# Patient Record
Sex: Male | Born: 1940 | Race: White | Hispanic: No | Smoking: Former smoker
Health system: Southern US, Community
[De-identification: ages and names within clinical notes are randomized; demographics above are authoritative.]

## PROBLEM LIST (undated history)

## (undated) DIAGNOSIS — J8410 Pulmonary fibrosis, unspecified: Secondary | ICD-10-CM

## (undated) DIAGNOSIS — C61 Malignant neoplasm of prostate: Secondary | ICD-10-CM

## (undated) DIAGNOSIS — J439 Emphysema, unspecified: Secondary | ICD-10-CM

## (undated) DIAGNOSIS — K219 Gastro-esophageal reflux disease without esophagitis: Secondary | ICD-10-CM

## (undated) DIAGNOSIS — C3490 Malignant neoplasm of unspecified part of unspecified bronchus or lung: Secondary | ICD-10-CM

## (undated) HISTORY — PX: HERNIA REPAIR: SHX51

---

## 2013-07-05 DIAGNOSIS — C61 Malignant neoplasm of prostate: Secondary | ICD-10-CM

## 2013-07-05 HISTORY — DX: Malignant neoplasm of prostate: C61

## 2013-07-05 HISTORY — PX: OTHER SURGICAL HISTORY: SHX169

## 2014-07-29 DIAGNOSIS — R5383 Other fatigue: Secondary | ICD-10-CM | POA: Diagnosis not present

## 2014-07-29 DIAGNOSIS — F17210 Nicotine dependence, cigarettes, uncomplicated: Secondary | ICD-10-CM | POA: Diagnosis not present

## 2014-07-29 DIAGNOSIS — N39498 Other specified urinary incontinence: Secondary | ICD-10-CM | POA: Diagnosis not present

## 2014-07-29 DIAGNOSIS — N5231 Erectile dysfunction following radical prostatectomy: Secondary | ICD-10-CM | POA: Diagnosis not present

## 2014-07-29 DIAGNOSIS — C61 Malignant neoplasm of prostate: Secondary | ICD-10-CM | POA: Diagnosis not present

## 2014-07-29 DIAGNOSIS — E291 Testicular hypofunction: Secondary | ICD-10-CM | POA: Diagnosis not present

## 2014-07-29 DIAGNOSIS — N3090 Cystitis, unspecified without hematuria: Secondary | ICD-10-CM | POA: Diagnosis not present

## 2014-10-29 DIAGNOSIS — N5231 Erectile dysfunction following radical prostatectomy: Secondary | ICD-10-CM | POA: Diagnosis not present

## 2014-10-29 DIAGNOSIS — N39498 Other specified urinary incontinence: Secondary | ICD-10-CM | POA: Diagnosis not present

## 2014-10-29 DIAGNOSIS — N3090 Cystitis, unspecified without hematuria: Secondary | ICD-10-CM | POA: Diagnosis not present

## 2014-10-29 DIAGNOSIS — C61 Malignant neoplasm of prostate: Secondary | ICD-10-CM | POA: Diagnosis not present

## 2015-10-22 DIAGNOSIS — R0602 Shortness of breath: Secondary | ICD-10-CM | POA: Diagnosis not present

## 2016-06-11 DIAGNOSIS — R0902 Hypoxemia: Secondary | ICD-10-CM | POA: Diagnosis not present

## 2016-06-11 DIAGNOSIS — J9621 Acute and chronic respiratory failure with hypoxia: Secondary | ICD-10-CM | POA: Diagnosis not present

## 2016-06-11 DIAGNOSIS — J8410 Pulmonary fibrosis, unspecified: Secondary | ICD-10-CM | POA: Diagnosis not present

## 2016-06-11 DIAGNOSIS — R918 Other nonspecific abnormal finding of lung field: Secondary | ICD-10-CM | POA: Diagnosis not present

## 2016-06-11 DIAGNOSIS — J479 Bronchiectasis, uncomplicated: Secondary | ICD-10-CM | POA: Diagnosis not present

## 2016-06-11 DIAGNOSIS — R0609 Other forms of dyspnea: Secondary | ICD-10-CM | POA: Diagnosis not present

## 2016-06-11 DIAGNOSIS — R0602 Shortness of breath: Secondary | ICD-10-CM | POA: Diagnosis not present

## 2016-06-11 DIAGNOSIS — D649 Anemia, unspecified: Secondary | ICD-10-CM | POA: Diagnosis not present

## 2016-06-12 DIAGNOSIS — R918 Other nonspecific abnormal finding of lung field: Secondary | ICD-10-CM

## 2016-06-12 DIAGNOSIS — J8410 Pulmonary fibrosis, unspecified: Secondary | ICD-10-CM

## 2016-06-12 DIAGNOSIS — R0602 Shortness of breath: Secondary | ICD-10-CM

## 2016-06-13 DIAGNOSIS — R918 Other nonspecific abnormal finding of lung field: Secondary | ICD-10-CM | POA: Diagnosis not present

## 2016-06-13 DIAGNOSIS — J90 Pleural effusion, not elsewhere classified: Secondary | ICD-10-CM | POA: Diagnosis not present

## 2016-06-13 DIAGNOSIS — J8410 Pulmonary fibrosis, unspecified: Secondary | ICD-10-CM | POA: Diagnosis not present

## 2016-06-13 DIAGNOSIS — R0602 Shortness of breath: Secondary | ICD-10-CM | POA: Diagnosis not present

## 2016-06-14 DIAGNOSIS — R918 Other nonspecific abnormal finding of lung field: Secondary | ICD-10-CM | POA: Diagnosis not present

## 2016-06-14 DIAGNOSIS — J90 Pleural effusion, not elsewhere classified: Secondary | ICD-10-CM | POA: Diagnosis not present

## 2016-06-14 DIAGNOSIS — R0602 Shortness of breath: Secondary | ICD-10-CM | POA: Diagnosis not present

## 2016-06-14 DIAGNOSIS — J8410 Pulmonary fibrosis, unspecified: Secondary | ICD-10-CM | POA: Diagnosis not present

## 2016-06-30 DIAGNOSIS — R911 Solitary pulmonary nodule: Secondary | ICD-10-CM | POA: Diagnosis not present

## 2016-06-30 DIAGNOSIS — R918 Other nonspecific abnormal finding of lung field: Secondary | ICD-10-CM | POA: Diagnosis not present

## 2016-07-01 DIAGNOSIS — J84112 Idiopathic pulmonary fibrosis: Secondary | ICD-10-CM | POA: Diagnosis not present

## 2016-07-01 DIAGNOSIS — R918 Other nonspecific abnormal finding of lung field: Secondary | ICD-10-CM | POA: Diagnosis not present

## 2016-07-01 DIAGNOSIS — J4530 Mild persistent asthma, uncomplicated: Secondary | ICD-10-CM | POA: Diagnosis not present

## 2016-07-13 DIAGNOSIS — R5383 Other fatigue: Secondary | ICD-10-CM | POA: Diagnosis not present

## 2016-07-13 DIAGNOSIS — C3490 Malignant neoplasm of unspecified part of unspecified bronchus or lung: Secondary | ICD-10-CM

## 2016-07-13 DIAGNOSIS — R5381 Other malaise: Secondary | ICD-10-CM | POA: Diagnosis not present

## 2016-07-13 DIAGNOSIS — R918 Other nonspecific abnormal finding of lung field: Secondary | ICD-10-CM | POA: Diagnosis not present

## 2016-07-13 DIAGNOSIS — C3492 Malignant neoplasm of unspecified part of left bronchus or lung: Secondary | ICD-10-CM | POA: Diagnosis not present

## 2016-07-13 DIAGNOSIS — Z7901 Long term (current) use of anticoagulants: Secondary | ICD-10-CM | POA: Diagnosis not present

## 2016-07-13 HISTORY — DX: Malignant neoplasm of unspecified part of unspecified bronchus or lung: C34.90

## 2016-07-15 DIAGNOSIS — J8410 Pulmonary fibrosis, unspecified: Secondary | ICD-10-CM | POA: Diagnosis not present

## 2016-07-19 DIAGNOSIS — J4530 Mild persistent asthma, uncomplicated: Secondary | ICD-10-CM | POA: Diagnosis not present

## 2016-07-19 DIAGNOSIS — R918 Other nonspecific abnormal finding of lung field: Secondary | ICD-10-CM | POA: Diagnosis not present

## 2016-07-19 DIAGNOSIS — J84112 Idiopathic pulmonary fibrosis: Secondary | ICD-10-CM | POA: Diagnosis not present

## 2016-07-29 DIAGNOSIS — R918 Other nonspecific abnormal finding of lung field: Secondary | ICD-10-CM | POA: Diagnosis not present

## 2016-07-29 DIAGNOSIS — C3490 Malignant neoplasm of unspecified part of unspecified bronchus or lung: Secondary | ICD-10-CM | POA: Diagnosis not present

## 2016-07-30 DIAGNOSIS — C3432 Malignant neoplasm of lower lobe, left bronchus or lung: Secondary | ICD-10-CM | POA: Diagnosis not present

## 2016-08-02 DIAGNOSIS — Z23 Encounter for immunization: Secondary | ICD-10-CM | POA: Diagnosis not present

## 2016-08-02 DIAGNOSIS — R918 Other nonspecific abnormal finding of lung field: Secondary | ICD-10-CM | POA: Diagnosis not present

## 2016-08-02 DIAGNOSIS — J4530 Mild persistent asthma, uncomplicated: Secondary | ICD-10-CM | POA: Diagnosis not present

## 2016-08-02 DIAGNOSIS — J84112 Idiopathic pulmonary fibrosis: Secondary | ICD-10-CM | POA: Diagnosis not present

## 2016-08-06 ENCOUNTER — Encounter: Payer: Self-pay | Admitting: Radiation Oncology

## 2016-08-09 ENCOUNTER — Ambulatory Visit
Admission: RE | Admit: 2016-08-09 | Discharge: 2016-08-09 | Disposition: A | Discharge status: Home / Self Care | Payer: Medicare Other | Source: Ambulatory Visit | Attending: Radiation Oncology | Admitting: Radiation Oncology

## 2016-08-09 ENCOUNTER — Encounter: Payer: Self-pay | Admitting: Radiation Oncology

## 2016-08-09 VITALS — BP 116/70 | HR 83 | Temp 97.6°F | Resp 20 | Ht 68.0 in | Wt 216.6 lb

## 2016-08-09 DIAGNOSIS — Z8546 Personal history of malignant neoplasm of prostate: Secondary | ICD-10-CM | POA: Insufficient documentation

## 2016-08-09 DIAGNOSIS — C3432 Malignant neoplasm of lower lobe, left bronchus or lung: Secondary | ICD-10-CM

## 2016-08-09 DIAGNOSIS — Z510 Encounter for antineoplastic radiation therapy: Secondary | ICD-10-CM | POA: Insufficient documentation

## 2016-08-09 DIAGNOSIS — Z9981 Dependence on supplemental oxygen: Secondary | ICD-10-CM | POA: Diagnosis not present

## 2016-08-09 DIAGNOSIS — K219 Gastro-esophageal reflux disease without esophagitis: Secondary | ICD-10-CM | POA: Diagnosis not present

## 2016-08-09 DIAGNOSIS — Z87891 Personal history of nicotine dependence: Secondary | ICD-10-CM | POA: Diagnosis not present

## 2016-08-09 DIAGNOSIS — Z79899 Other long term (current) drug therapy: Secondary | ICD-10-CM | POA: Diagnosis not present

## 2016-08-09 HISTORY — DX: Pulmonary fibrosis, unspecified: J84.10

## 2016-08-09 HISTORY — DX: Gastro-esophageal reflux disease without esophagitis: K21.9

## 2016-08-09 HISTORY — DX: Malignant neoplasm of unspecified part of unspecified bronchus or lung: C34.90

## 2016-08-09 HISTORY — DX: Malignant neoplasm of prostate: C61

## 2016-08-09 HISTORY — DX: Emphysema, unspecified: J43.9

## 2016-08-09 NOTE — Progress Notes (Signed)
Radiation Oncology         [phone] ________________________________  Name: Joseph House MRN: [id number]  Date: 08/09/2016  DOB: Aug 19, 1940  CC:No primary care provider on file.  Marice Potter, MD     REFERRING PHYSICIAN: Marice Potter, MD   DIAGNOSIS: The encounter diagnosis was Primary malignant neoplasm of bronchus of left lower lobe ([place]).   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 male seen at the request of Dr. Alcide Clever for a new diagnosis of lung cancer. The patient has a history of prostate cancer treated surgically with prostatectomy in 2015. Details of his diagnosis are not available at the time of dictation. The patient presented to the ED on 06/11/16 for a 6 month history of shortness of breath, cough, and elevated D-dimer. The patient has a history of pulmonary fibrosis. CT angiogram of the chest w/ contrast at the time noted a 2.4 x 2.2 cm round nodular opacity in the left lower lobe with spiculated and irregular margins, mild mediastinal and left hilar adenopathy, and emphysema.  A PET scan on 06/25/16 revealed hypermetabolic 2.5 cm left lower lobe lesion, with SUV max 6.5, and no hypermetabolic mediastinal or hilar lymph nodes suggesting metastatic disease. CT guided biopsy of the LLL lesion on 07/13/2016 revealed poorly differentiated carcinoma. MRI of the brain on 07/29/16 at [facility] was negative for disease. The patient, his wife, and daughter present today to discuss the options for SBRT.  PREVIOUS RADIATION THERAPY: No   PAST MEDICAL HISTORY:  Past Medical History:  Diagnosis Date  . Emphysema of lung ([place])   . GERD (gastroesophageal reflux disease)   . Lung cancer ([place]) 07/13/2016   Left Lower Lobe  . Prostate cancer ([place]) 2015   Prostatectomy  . Pulmonary fibrosis ([place])        PAST SURGICAL HISTORY: Past Surgical History:    Procedure Laterality Date  . HERNIA REPAIR    . prostatetectomy  2015     FAMILY HISTORY:  Family History  Problem Relation Age of Onset  . Colon cancer Mother      SOCIAL HISTORY:  reports that he has quit smoking. His smoking use included Cigarettes. He has a 80.00 pack-year smoking history. He quit smokeless tobacco use today. His smokeless tobacco use included Chew. He reports that he does not drink alcohol or use drugs. The patient is single but has a lady friend who accompanied him today. He is retired from working in the furniture [employer] and lives in [place]: Patient has no allergy information on record.   MEDICATIONS:  Current Outpatient Prescriptions  Medication Sig Dispense Refill  . dextromethorphan-guaiFENesin (MUCINEX DM) 30-600 MG 12hr tablet Take 1 tablet by mouth 2 (two) times daily.    . pantoprazole (PROTONIX) 20 MG tablet Take 20 mg by mouth daily.    Marland Kitchen acetaminophen (ACETAMINOPHEN 8 HOUR) 650 MG CR tablet Take 650 mg by mouth every 8 (eight) hours as needed for pain.    . benzonatate (TESSALON) 200 MG capsule Take 200 mg by mouth 3 (three) times daily as needed for cough.     No current facility-administered medications for this encounter.      REVIEW OF SYSTEMS: On review of systems, the patient reports that he is doing well overall. He denies any chest pain, fevers, chills, night sweats, cough,  hemoptysis, or unintended weight changes. The patient has shortness of breath with exertion. The patient is on 2L of oxygen and has been on it since his diagnosis. He denies any bowel or bladder disturbances, and denies abdominal pain, nausea or vomiting. He denies any new musculoskeletal or joint aches or pains. A complete review of systems is obtained and is otherwise negative.  PHYSICAL EXAM:  Wt Readings from Last 3 Encounters:  08/09/16 216 lb 9.6 oz (98.2 kg)   Temp Readings from Last 3 Encounters:  08/09/16 97.6 F (36.4 C)  (Oral)   BP Readings from Last 3 Encounters:  08/09/16 116/70   Pulse Readings from Last 3 Encounters:  08/09/16 83    In general this is a well appearing Caucasian male in no acute distress. He is alert and oriented x4 and appropriate throughout the examination. HEENT reveals that the patient is normocephalic, atraumatic. EOMs are intact. PERRLA. Skin is intact without any evidence of gross lesions. Cardiovascular exam reveals a regular rate and rhythm, no clicks rubs or murmurs are auscultated. Chest is clear to auscultation bilaterally, but restricted air movement was noted. Nasal cannula in place with 2L of oxygen. Lymphatic assessment is performed and does not reveal any adenopathy in the cervical, supraclavicular, axillary, or inguinal chains. Abdomen has active bowel sounds in all quadrants and is intact. The abdomen is soft, non tender, non distended. Lower extremities are negative for pretibial pitting edema, deep calf tenderness, cyanosis or clubbing.   ECOG = 1  0 - Asymptomatic (Fully active, able to carry on all predisease activities without restriction)  1 - Symptomatic but completely ambulatory (Restricted in physically strenuous activity but ambulatory and able to carry out work of a light or sedentary nature. For example, light housework, office work)  2 - Symptomatic, <50% in bed during the day (Ambulatory and capable of all self care but unable to carry out any work activities. Up and about more than 50% of waking hours)  3 - Symptomatic, >50% in bed, but not bedbound (Capable of only limited self-care, confined to bed or chair 50% or more of waking hours)  4 - Bedbound (Completely disabled. Cannot carry on any self-care. Totally confined to bed or chair)  5 - Death   Eustace Pen MM, Creech RH, Tormey DC, et al. [phone]). "Toxicity and response criteria of the [facility] Group". [place] Oncol. 5 (6): 649-55    LABORATORY DATA:  No results found for:  WBC, HGB, HCT, MCV, PLT No results found for: NA, K, CL, CO2 No results found for: ALT, AST, GGT, ALKPHOS, BILITOT    RADIOGRAPHY: No results found.     IMPRESSION/PLAN: 1. Stage IA, T1b, N0, M0, NSCLC, poorly differentiated carcinoma of the LLL of the lung. Dr. Lisbeth Renshaw reviews the findings from imaging studies and pathology. We discussed his disease appears to be early stage, and would benefit from SBRT, providing  greater than 90% local control. We discussed that he may fail in the mediastinum hilum or elsewhere in the body and radiation was a local only process. We discussed the process of simulation and the use of a pad all to decrease respiratory motion. We discussed the use of 4-dimensional simulation to minimize normal lung tissue treated and the use of respiratory compression. We discussed 3- 5 treatments occurring every other day as an outpatient.  We discussed that SBRT was unlikely to make his breathing any worse. It is also unlikely to make his breathing symptoms any better.  We discussed possible side effects including shoulder pain due to arm positioning and possible rib fracture withthe pleural-based nodules proximity to the ribs. We discussed damage to other critical normal structures including heart, ribs, lung collapse, chronic cough, and brachial plexus injury. We discussed that without treatment this  could develop into a more aggressive or even metastatic cancer. The patient signed a consent form and a copy was placed in his medical chart. He was scheduled for simulation on 08/13/15 at 1pm.  The above documentation reflects my direct findings during this shared patient visit. Please see the separate note by Dr. Lisbeth Renshaw on this date for the remainder of the patient's plan of care.     Carola Rhine, PAC     This document serves as a record of services personally performed by Shona Simpson, PA-C and Kyung Rudd, MD. It was created on their behalf by Darcus Austin, a trained medical  scribe. The creation of this record is based on the scribe's personal observations and the providers' statements to them. This document has been checked and approved by the attending provider.

## 2016-08-09 NOTE — Progress Notes (Signed)
Please see the Nurse Progress Note in the MD Initial Consult Encounter for this patient. 

## 2016-08-09 NOTE — Addendum Note (Signed)
Encounter addended by: Doreen Beam, RN on: 08/09/2016 12:08 PM<BR>    Actions taken: Patient Education assessment filed

## 2016-08-12 ENCOUNTER — Ambulatory Visit
Admission: RE | Admit: 2016-08-12 | Discharge: 2016-08-12 | Disposition: A | Discharge status: Home / Self Care | Payer: Medicare Other | Source: Ambulatory Visit | Attending: Radiation Oncology | Admitting: Radiation Oncology

## 2016-08-12 DIAGNOSIS — Z79899 Other long term (current) drug therapy: Secondary | ICD-10-CM | POA: Diagnosis not present

## 2016-08-12 DIAGNOSIS — Z87891 Personal history of nicotine dependence: Secondary | ICD-10-CM | POA: Diagnosis not present

## 2016-08-12 DIAGNOSIS — C3432 Malignant neoplasm of lower lobe, left bronchus or lung: Secondary | ICD-10-CM

## 2016-08-12 DIAGNOSIS — Z510 Encounter for antineoplastic radiation therapy: Secondary | ICD-10-CM | POA: Diagnosis not present

## 2016-08-12 DIAGNOSIS — K219 Gastro-esophageal reflux disease without esophagitis: Secondary | ICD-10-CM | POA: Diagnosis not present

## 2016-08-13 DIAGNOSIS — C3432 Malignant neoplasm of lower lobe, left bronchus or lung: Secondary | ICD-10-CM | POA: Insufficient documentation

## 2016-08-13 NOTE — Progress Notes (Signed)
[facility] Radiation Oncology Simulation and Treatment Planning Note   Name:  Joseph House MRN: [id number]   Date: 08/13/2016  DOB: [birth year]  Status:outpatient    DIAGNOSIS:    ICD-9-CM ICD-10-CM   1. Malignant neoplasm of bronchus of left lower lobe (HCC) 162.5 C34.32      CONSENT VERIFIED:yes   SET UP: Patient is setup supine   IMMOBILIZATION: The patient was immobilized using a Vac Loc bag and Abdominal Compression.   NARRATIVE:The patient was brought to the [facility].  Identity was confirmed.  All relevant records and images related to the planned course of therapy were reviewed.  Then, the patient was positioned in a stable reproducible clinical set-up for radiation therapy. Abdominal compression was applied by me.  4D CT images were obtained and reproducible breathing pattern was confirmed. Free breathing CT images were obtained.  Skin markings were placed.  The CT images were loaded into the planning software where the target and avoidance structures were contoured.  The radiation prescription was entered and confirmed.    TREATMENT PLANNING NOTE:  Treatment planning then occurred. I have requested : MLC's, isodose plan, basic dose calculation.  3 dimensional simulation is performed and dose volume histogram of the gross tumor volume, planning tumor volume and criticial normal structures including the spinal cord and lungs were analyzed and requested.  Special treatment procedure was performed due to high dose per fraction.  The patient will be monitored for increased risk of toxicity.  Daily imaging using cone beam CT will be used for target localization.  I anticipate that the patient will receive 54 Gy in 3 fractions to target volume. Further adjustments will be made based on the planning process is necessary.  ------------------------------------------------  Jodelle Gross, MD, PhD

## 2016-08-15 DIAGNOSIS — J8410 Pulmonary fibrosis, unspecified: Secondary | ICD-10-CM | POA: Diagnosis not present

## 2016-08-23 DIAGNOSIS — Z79899 Other long term (current) drug therapy: Secondary | ICD-10-CM | POA: Diagnosis not present

## 2016-08-23 DIAGNOSIS — Z87891 Personal history of nicotine dependence: Secondary | ICD-10-CM | POA: Diagnosis not present

## 2016-08-23 DIAGNOSIS — C3432 Malignant neoplasm of lower lobe, left bronchus or lung: Secondary | ICD-10-CM | POA: Diagnosis not present

## 2016-08-23 DIAGNOSIS — Z510 Encounter for antineoplastic radiation therapy: Secondary | ICD-10-CM | POA: Diagnosis not present

## 2016-08-23 DIAGNOSIS — K219 Gastro-esophageal reflux disease without esophagitis: Secondary | ICD-10-CM | POA: Diagnosis not present

## 2016-08-24 ENCOUNTER — Ambulatory Visit
Admission: RE | Admit: 2016-08-24 | Discharge: 2016-08-24 | Disposition: A | Discharge status: Home / Self Care | Payer: Medicare Other | Source: Ambulatory Visit | Attending: Radiation Oncology | Admitting: Radiation Oncology

## 2016-08-24 DIAGNOSIS — Z79899 Other long term (current) drug therapy: Secondary | ICD-10-CM | POA: Diagnosis not present

## 2016-08-24 DIAGNOSIS — C3432 Malignant neoplasm of lower lobe, left bronchus or lung: Secondary | ICD-10-CM | POA: Diagnosis not present

## 2016-08-24 DIAGNOSIS — Z510 Encounter for antineoplastic radiation therapy: Secondary | ICD-10-CM | POA: Diagnosis not present

## 2016-08-24 DIAGNOSIS — Z87891 Personal history of nicotine dependence: Secondary | ICD-10-CM | POA: Diagnosis not present

## 2016-08-24 DIAGNOSIS — K219 Gastro-esophageal reflux disease without esophagitis: Secondary | ICD-10-CM | POA: Diagnosis not present

## 2016-08-25 ENCOUNTER — Ambulatory Visit: Payer: Medicare Other | Admitting: Radiation Oncology

## 2016-08-26 ENCOUNTER — Ambulatory Visit
Admission: RE | Admit: 2016-08-26 | Discharge: 2016-08-26 | Disposition: A | Discharge status: Home / Self Care | Payer: Medicare Other | Source: Ambulatory Visit | Attending: Radiation Oncology | Admitting: Radiation Oncology

## 2016-08-26 DIAGNOSIS — Z87891 Personal history of nicotine dependence: Secondary | ICD-10-CM | POA: Diagnosis not present

## 2016-08-26 DIAGNOSIS — Z510 Encounter for antineoplastic radiation therapy: Secondary | ICD-10-CM | POA: Diagnosis not present

## 2016-08-26 DIAGNOSIS — K219 Gastro-esophageal reflux disease without esophagitis: Secondary | ICD-10-CM | POA: Diagnosis not present

## 2016-08-26 DIAGNOSIS — Z79899 Other long term (current) drug therapy: Secondary | ICD-10-CM | POA: Diagnosis not present

## 2016-08-26 DIAGNOSIS — C3432 Malignant neoplasm of lower lobe, left bronchus or lung: Secondary | ICD-10-CM | POA: Diagnosis not present

## 2016-08-27 ENCOUNTER — Ambulatory Visit: Payer: Medicare Other | Admitting: Radiation Oncology

## 2016-08-30 ENCOUNTER — Ambulatory Visit
Admission: RE | Admit: 2016-08-30 | Discharge: 2016-08-30 | Disposition: A | Discharge status: Home / Self Care | Payer: Medicare Other | Source: Ambulatory Visit | Attending: Radiation Oncology | Admitting: Radiation Oncology

## 2016-08-30 DIAGNOSIS — Z79899 Other long term (current) drug therapy: Secondary | ICD-10-CM | POA: Diagnosis not present

## 2016-08-30 DIAGNOSIS — C3432 Malignant neoplasm of lower lobe, left bronchus or lung: Secondary | ICD-10-CM | POA: Diagnosis not present

## 2016-08-30 DIAGNOSIS — Z87891 Personal history of nicotine dependence: Secondary | ICD-10-CM | POA: Diagnosis not present

## 2016-08-30 DIAGNOSIS — K219 Gastro-esophageal reflux disease without esophagitis: Secondary | ICD-10-CM | POA: Diagnosis not present

## 2016-08-30 DIAGNOSIS — Z510 Encounter for antineoplastic radiation therapy: Secondary | ICD-10-CM | POA: Diagnosis not present

## 2016-09-01 ENCOUNTER — Ambulatory Visit
Admission: RE | Admit: 2016-09-01 | Discharge: 2016-09-01 | Disposition: A | Discharge status: Home / Self Care | Payer: Medicare Other | Source: Ambulatory Visit | Attending: Radiation Oncology | Admitting: Radiation Oncology

## 2016-09-01 DIAGNOSIS — Z79899 Other long term (current) drug therapy: Secondary | ICD-10-CM | POA: Diagnosis not present

## 2016-09-01 DIAGNOSIS — C3432 Malignant neoplasm of lower lobe, left bronchus or lung: Secondary | ICD-10-CM | POA: Diagnosis not present

## 2016-09-01 DIAGNOSIS — Z510 Encounter for antineoplastic radiation therapy: Secondary | ICD-10-CM | POA: Diagnosis not present

## 2016-09-01 DIAGNOSIS — K219 Gastro-esophageal reflux disease without esophagitis: Secondary | ICD-10-CM | POA: Diagnosis not present

## 2016-09-01 DIAGNOSIS — Z87891 Personal history of nicotine dependence: Secondary | ICD-10-CM | POA: Diagnosis not present

## 2016-09-03 ENCOUNTER — Encounter: Payer: Self-pay | Admitting: Radiation Oncology

## 2016-09-03 ENCOUNTER — Ambulatory Visit
Admission: RE | Admit: 2016-09-03 | Discharge: 2016-09-03 | Disposition: A | Discharge status: Home / Self Care | Payer: Medicare Other | Source: Ambulatory Visit | Attending: Radiation Oncology | Admitting: Radiation Oncology

## 2016-09-03 DIAGNOSIS — Z79899 Other long term (current) drug therapy: Secondary | ICD-10-CM | POA: Diagnosis not present

## 2016-09-03 DIAGNOSIS — K219 Gastro-esophageal reflux disease without esophagitis: Secondary | ICD-10-CM | POA: Diagnosis not present

## 2016-09-03 DIAGNOSIS — C3432 Malignant neoplasm of lower lobe, left bronchus or lung: Secondary | ICD-10-CM

## 2016-09-03 DIAGNOSIS — Z87891 Personal history of nicotine dependence: Secondary | ICD-10-CM | POA: Diagnosis not present

## 2016-09-03 DIAGNOSIS — Z510 Encounter for antineoplastic radiation therapy: Secondary | ICD-10-CM | POA: Diagnosis not present

## 2016-09-03 NOTE — Progress Notes (Signed)
Mr. Hardge presents for his last fraction of SBRT radiation to his left lung. He denies pain. He reports some mild fatigue. He has some shortness of breath with activity. He is receiving oxygen continuously at 1-2 Liter/minute. He does increase it to 3 Liters to help with shortness of breath at times. He reports a productive cough of clear/white sputum, which he states is new since starting radiation.He is eating well.   BP (!) 128/103   Pulse (!) 59   Temp 97.6 F (36.4 C)   Ht '5\' 8"'$  (1.727 m)   Wt 214 lb 3.2 oz (97.2 kg)   SpO2 100% Comment: 3 Liters  BMI 32.57 kg/m    Wt Readings from Last 3 Encounters:  09/03/16 214 lb 3.2 oz (97.2 kg)  08/09/16 216 lb 9.6 oz (98.2 kg)

## 2016-09-05 NOTE — Progress Notes (Signed)
   Department of Radiation Oncology  Phone:  [phone] Fax:        [phone]  Weekly Treatment Note    Name: Joseph House Date: 09/05/2016 MRN: [id number] DOB: 12/21/1940   Diagnosis:     ICD-9-CM ICD-10-CM   1. Malignant neoplasm of bronchus of left lower lobe (HCC) 162.5 C34.32      Current dose: 50 Gy  Current fraction: 5   MEDICATIONS: Current Outpatient Prescriptions  Medication Sig Dispense Refill  . acetaminophen (ACETAMINOPHEN 8 HOUR) 650 MG CR tablet Take 650 mg by mouth every 8 (eight) hours as needed for pain.    . benzonatate (TESSALON) 200 MG capsule Take 200 mg by mouth 3 (three) times daily as needed for cough.    . dextromethorphan-guaiFENesin (MUCINEX DM) 30-600 MG 12hr tablet Take 1 tablet by mouth 2 (two) times daily.    . pantoprazole (PROTONIX) 20 MG tablet Take 20 mg by mouth daily.     No current facility-administered medications for this encounter.      ALLERGIES: Patient has no known allergies.   LABORATORY DATA:  No results found for: WBC, HGB, HCT, MCV, PLT No results found for: NA, K, CL, CO2 No results found for: ALT, AST, GGT, ALKPHOS, BILITOT   NARRATIVE: Joseph House was seen today for weekly treatment management. The chart was checked and the patient's films were reviewed.  The patient finished his final fraction today. He states he has done very well. No change in shortness of breath. No esophagitis.  PHYSICAL EXAMINATION: vitals were not taken for this visit.     Alert, no acute distress  ASSESSMENT: The patient is doing satisfactorily with treatment.  He finished his final fraction today.   PLAN:  Follow-up in one month, where we will further discuss additional  post treatment imaging.

## 2016-09-12 DIAGNOSIS — J8410 Pulmonary fibrosis, unspecified: Secondary | ICD-10-CM | POA: Diagnosis not present

## 2016-09-13 ENCOUNTER — Encounter: Payer: Self-pay | Admitting: Radiation Oncology

## 2016-09-13 DIAGNOSIS — R918 Other nonspecific abnormal finding of lung field: Secondary | ICD-10-CM | POA: Diagnosis not present

## 2016-09-13 DIAGNOSIS — J84112 Idiopathic pulmonary fibrosis: Secondary | ICD-10-CM | POA: Diagnosis not present

## 2016-09-13 DIAGNOSIS — J4530 Mild persistent asthma, uncomplicated: Secondary | ICD-10-CM | POA: Diagnosis not present

## 2016-09-13 NOTE — Progress Notes (Signed)
  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 During treatment, the patient reported no change in shortness of breath, or esophagitis.  Plan: The patient has completed radiation treatment. The patient will return to radiation oncology clinic for routine followup in one month. I advised them to call or return sooner if they have any questions or concerns related to their recovery or treatment.  ------------------------------------------------  Jodelle Gross, MD, PhD

## 2016-09-27 DIAGNOSIS — J42 Unspecified chronic bronchitis: Secondary | ICD-10-CM | POA: Diagnosis not present

## 2016-09-27 DIAGNOSIS — R0602 Shortness of breath: Secondary | ICD-10-CM | POA: Diagnosis not present

## 2016-09-27 DIAGNOSIS — R001 Bradycardia, unspecified: Secondary | ICD-10-CM | POA: Diagnosis not present

## 2016-09-27 DIAGNOSIS — C3492 Malignant neoplasm of unspecified part of left bronchus or lung: Secondary | ICD-10-CM | POA: Diagnosis not present

## 2016-10-05 NOTE — Progress Notes (Addendum)
Joseph House 76 y.o. man with Stage IA, T1b, N0, M0, NSCLC, poorly differentiated carcinoma of the lower lobeof the left lung radiation completed 09-03-16 one month FU.     Weight changes, if any: Wt Readings from Last 3 Encounters:  10/12/16 208 lb 12.8 oz (94.7 kg)  09/03/16 214 lb 3.2 oz (97.2 kg)  08/09/16 216 lb 9.6 oz (98.2 kg)   Respiratory complaints, if any: Coughing thick secretions white, SOB O 2 L/M nasal cannular Hemoptysis, if any: None  Swallowing Problems/Pain/Difficulty swallowing:None Smoking Tobacco/Marijuana/Snuff/ETOH use:Former smoker x 40 years 2 P/D former chewing tobacco user no drug or alcohol usage Appetite :Good eating three meals per day with snacks Pain:None When is next chemo scheduled?:N/A Lab work from of chart:N/A Imaging:N/A BP 113/68   Pulse 91   Temp 97.8 F (36.6 C) (Oral)   Resp 18   Ht '5\' 8"'$  (1.727 m)   Wt 208 lb 12.8 oz (94.7 kg)   SpO2 100%   BMI 31.75 kg/m

## 2016-10-07 DIAGNOSIS — R001 Bradycardia, unspecified: Secondary | ICD-10-CM | POA: Diagnosis not present

## 2016-10-11 DIAGNOSIS — R001 Bradycardia, unspecified: Secondary | ICD-10-CM | POA: Diagnosis not present

## 2016-10-12 ENCOUNTER — Ambulatory Visit
Admission: RE | Admit: 2016-10-12 | Discharge: 2016-10-12 | Disposition: A | Discharge status: Home / Self Care | Payer: Medicare Other | Source: Ambulatory Visit | Attending: Radiation Oncology | Admitting: Radiation Oncology

## 2016-10-12 ENCOUNTER — Encounter: Payer: Self-pay | Admitting: Radiation Oncology

## 2016-10-12 VITALS — BP 113/68 | HR 91 | Temp 97.8°F | Resp 18 | Ht 68.0 in | Wt 208.8 lb

## 2016-10-12 DIAGNOSIS — Z923 Personal history of irradiation: Secondary | ICD-10-CM | POA: Insufficient documentation

## 2016-10-12 DIAGNOSIS — Z79899 Other long term (current) drug therapy: Secondary | ICD-10-CM | POA: Diagnosis not present

## 2016-10-12 DIAGNOSIS — C3432 Malignant neoplasm of lower lobe, left bronchus or lung: Secondary | ICD-10-CM | POA: Diagnosis not present

## 2016-10-12 DIAGNOSIS — R5383 Other fatigue: Secondary | ICD-10-CM | POA: Diagnosis not present

## 2016-10-12 NOTE — Addendum Note (Signed)
Encounter addended by: Malena Edman, RN on: 10/12/2016 11:14 AM<BR>    Actions taken: Charge Capture section accepted

## 2016-10-12 NOTE — Progress Notes (Signed)
  Radiation Oncology         [phone] ________________________________  Name: Joseph House MRN: [id number]  Date: 10/12/2016  DOB: Sep 14, 1940  Post Treatment Note  CC: No primary care provider on file.  Bobby Rumpf Dequincy A, MD  Diagnosis:   Stage IA, T1b,N0,M0, NSCLC, poorly differentiated carcinoma of the left lower lobe of the lung.   Interval Since Last Radiation:  5 weeks   08/24/16-09/03/16 SBRT Treatment:  left lower lobe lung/ 50 Gy in 5 fractions  Narrative:  The patient returns today for routine follow-up.  He tolerated radiotherapy well without disruption of his appointments. He did develop fatigue following his last fraction.                        On review of systems, the patient states he is doing well now. He denies any difficulty with shortness of breath, fevers, chills, chest pain, or skin concerns. He continues on 2L [place] of Oxygen and reports demands for O2 have not increased since radiotherapy. No other complaints are noted.  ALLERGIES:  has No Known Allergies.  Meds: Current Outpatient Prescriptions  Medication Sig Dispense Refill  . pantoprazole (PROTONIX) 20 MG tablet Take 20 mg by mouth daily.    Marland Kitchen acetaminophen (ACETAMINOPHEN 8 HOUR) 650 MG CR tablet Take 650 mg by mouth every 8 (eight) hours as needed for pain.    . benzonatate (TESSALON) 200 MG capsule Take 200 mg by mouth 3 (three) times daily as needed for cough.    . dextromethorphan-guaiFENesin (MUCINEX DM) 30-600 MG 12hr tablet Take 1 tablet by mouth 2 (two) times daily.     No current facility-administered medications for this encounter.     Physical Findings:  height is '5\' 8"'$  (1.727 m) and weight is 208 lb 12.8 oz (94.7 kg). His oral temperature is 97.8 F (36.6 C). His blood pressure is 113/68 and his pulse is 91. His respiration is 18 and oxygen saturation is 100%.  Pain Assessment Pain Score: 0-No pain/10 In general this is a well appearing caucasian male in no acute distress. He's alert and  oriented x4 and appropriate throughout the examination. Cardiopulmonary assessment is negative for acute distress and he exhibits normal effort.   Lab Findings: No results found for: WBC, HGB, HCT, MCV, PLT   Radiographic Findings: No results found.  Impression/Plan: 1. Stage IA, T1b,N0,M0, NSCLC, poorly differentiated carcinoma of the left lower lobe of the lung. The patient appears to be doing well since treatment. His fatigue is improving, and we would anticipate starting post treatment surveillance with a CT scan. He's currently scheduled for CT in about 1 month with Dr. Bobby Rumpf in [place]. I will contact him by phone once this has been performed. If the results are stable since treatment, we will plan to follow along as needed. We discussed NCCN guidelines for follow up of his cancer would indicate a CT of the chest q 6 months until 5 years, then annually thereafter. We also discussed however taking into consideration his overall health status, which could change the frequency he and Dr. Bobby Rumpf determine for surveillance. He's in agreement and will contact us if he has questions or concerns, and I did outline precautions to call if he were to experience symptoms of pneumonitis.     Carola Rhine, PAC

## 2016-10-13 DIAGNOSIS — J8410 Pulmonary fibrosis, unspecified: Secondary | ICD-10-CM | POA: Diagnosis not present

## 2016-10-27 DIAGNOSIS — D508 Other iron deficiency anemias: Secondary | ICD-10-CM | POA: Diagnosis not present

## 2016-10-27 DIAGNOSIS — R001 Bradycardia, unspecified: Secondary | ICD-10-CM | POA: Diagnosis not present

## 2016-10-27 DIAGNOSIS — C3492 Malignant neoplasm of unspecified part of left bronchus or lung: Secondary | ICD-10-CM | POA: Diagnosis not present

## 2016-10-27 DIAGNOSIS — J42 Unspecified chronic bronchitis: Secondary | ICD-10-CM | POA: Diagnosis not present

## 2016-10-27 DIAGNOSIS — R0602 Shortness of breath: Secondary | ICD-10-CM | POA: Diagnosis not present

## 2016-11-12 DIAGNOSIS — J8410 Pulmonary fibrosis, unspecified: Secondary | ICD-10-CM | POA: Diagnosis not present

## 2016-11-25 DIAGNOSIS — C3490 Malignant neoplasm of unspecified part of unspecified bronchus or lung: Secondary | ICD-10-CM | POA: Diagnosis not present

## 2016-11-25 DIAGNOSIS — I77810 Thoracic aortic ectasia: Secondary | ICD-10-CM | POA: Diagnosis not present

## 2016-11-25 DIAGNOSIS — C3432 Malignant neoplasm of lower lobe, left bronchus or lung: Secondary | ICD-10-CM | POA: Diagnosis not present

## 2016-11-26 DIAGNOSIS — J8410 Pulmonary fibrosis, unspecified: Secondary | ICD-10-CM | POA: Diagnosis not present

## 2016-11-26 DIAGNOSIS — C3432 Malignant neoplasm of lower lobe, left bronchus or lung: Secondary | ICD-10-CM | POA: Diagnosis not present

## 2016-12-13 DIAGNOSIS — J8410 Pulmonary fibrosis, unspecified: Secondary | ICD-10-CM | POA: Diagnosis not present

## 2016-12-20 ENCOUNTER — Telehealth: Payer: Self-pay | Admitting: Radiation Oncology

## 2016-12-20 DIAGNOSIS — J4530 Mild persistent asthma, uncomplicated: Secondary | ICD-10-CM | POA: Diagnosis not present

## 2016-12-20 DIAGNOSIS — R918 Other nonspecific abnormal finding of lung field: Secondary | ICD-10-CM | POA: Diagnosis not present

## 2016-12-20 DIAGNOSIS — J84112 Idiopathic pulmonary fibrosis: Secondary | ICD-10-CM | POA: Diagnosis not present

## 2016-12-20 NOTE — Telephone Encounter (Signed)
LM for pt to be aware of our knowledge of his recent CT that showed improvement in his previously treated left lung disease.

## 2017-03-29 DIAGNOSIS — Z923 Personal history of irradiation: Secondary | ICD-10-CM | POA: Diagnosis not present

## 2017-03-29 DIAGNOSIS — Z85118 Personal history of other malignant neoplasm of bronchus and lung: Secondary | ICD-10-CM | POA: Diagnosis not present

## 2017-07-29 DIAGNOSIS — Z85118 Personal history of other malignant neoplasm of bronchus and lung: Secondary | ICD-10-CM | POA: Diagnosis not present

## 2017-08-10 DIAGNOSIS — R0602 Shortness of breath: Secondary | ICD-10-CM

## 2017-11-25 DIAGNOSIS — Z85118 Personal history of other malignant neoplasm of bronchus and lung: Secondary | ICD-10-CM | POA: Diagnosis not present

## 2018-03-28 DIAGNOSIS — Z85118 Personal history of other malignant neoplasm of bronchus and lung: Secondary | ICD-10-CM

## 2018-03-28 DIAGNOSIS — Z923 Personal history of irradiation: Secondary | ICD-10-CM | POA: Diagnosis not present

## 2018-07-31 DIAGNOSIS — Z85118 Personal history of other malignant neoplasm of bronchus and lung: Secondary | ICD-10-CM | POA: Diagnosis not present

## 2018-07-31 DIAGNOSIS — J8410 Pulmonary fibrosis, unspecified: Secondary | ICD-10-CM

## 2018-07-31 DIAGNOSIS — Z923 Personal history of irradiation: Secondary | ICD-10-CM

## 2019-01-30 DIAGNOSIS — C3432 Malignant neoplasm of lower lobe, left bronchus or lung: Secondary | ICD-10-CM

## 2019-08-02 DIAGNOSIS — C3432 Malignant neoplasm of lower lobe, left bronchus or lung: Secondary | ICD-10-CM

## 2020-01-30 DIAGNOSIS — C3432 Malignant neoplasm of lower lobe, left bronchus or lung: Secondary | ICD-10-CM

## 2020-07-31 NOTE — Progress Notes (Signed)
[facility]  [address] [place],  [place]  [address] [phone]  Clinic Day:  08/01/2020  Referring physician: No ref. provider found   HISTORY OF PRESENT ILLNESS:  The patient is a 80 y.o. 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  He underwent stereotactic radiation in March 2018 as his poor lung health precluded him from undergoing lung surgery.  A chest CT at his last visit continued to show no evidence of disease recurrence.  He comes in today for routine follow-up, as well as to review his chest x-ray done today.  Since his last visit, patient has been doing okay.  He has chronic issues with breathing as it pertains to his underlying pulmonary fibrosis/pneumonitis.  However, he denies having any new respiratory symptoms which concern him for recurrent lung cancer.  PHYSICAL EXAM:  Blood pressure (!) 147/74, pulse 80, temperature (!) 97.5 F (36.4 C), resp. rate 16, height 5\' 8"  (1.727 m), weight 163 lb 14.4 oz (74.3 kg). Wt Readings from Last 3 Encounters:  08/01/20 163 lb 14.4 oz (74.3 kg)  10/12/16 208 lb 12.8 oz (94.7 kg)  09/03/16 214 lb 3.2 oz (97.2 kg)   Body mass index is 24.92 kg/m. Performance status (ECOG): 2 Physical Exam Constitutional:      Appearance: Normal appearance. He is not ill-appearing.  HENT:     Mouth/Throat:     Mouth: Mucous membranes are moist.     Pharynx: Oropharynx is clear. No oropharyngeal exudate or posterior oropharyngeal erythema.  Cardiovascular:     Rate and Rhythm: Normal rate and regular rhythm.     Heart sounds: No murmur heard. No friction rub. No gallop.   Pulmonary:     Effort: Pulmonary effort is normal. No respiratory distress.     Breath sounds: Normal breath sounds. No wheezing, rhonchi or rales.  Chest:  Breasts:     Right: No axillary adenopathy or supraclavicular adenopathy.     Left: No axillary adenopathy or supraclavicular adenopathy.     Abdominal:     General: Bowel sounds are normal. There is no distension.     Palpations: Abdomen is soft. There is no mass.     Tenderness: There is no abdominal tenderness.  Musculoskeletal:        General: No swelling.     Right lower leg: No edema.     Left lower leg: No edema.  Lymphadenopathy:     Cervical: No cervical adenopathy.     Upper Body:     Right upper body: No supraclavicular or axillary adenopathy.     Left upper body: No supraclavicular or axillary adenopathy.     Lower Body: No right inguinal adenopathy. No left inguinal adenopathy.  Skin:    General: Skin is warm.     Coloration: Skin is not jaundiced.     Findings: No lesion or rash.  Neurological:     General: No focal deficit present.     Mental Status: He is alert and oriented to person, place, and time. Mental status is at baseline.     Cranial Nerves: Cranial nerves are intact.  Psychiatric:        Mood and Affect: Mood normal.        Behavior: Behavior normal.        Thought Content: Thought content normal.    STUDIES:  His chest x-ray done today showed chronic bilateral fibrosis, but no new  lung lesions to suggest a new malignancy.  ASSESSMENT & PLAN:  Assessment/Plan:  A 80 y.o. 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  In clinic today, I went over his chest x-ray with him, for which he can see there remains no evidence of disease recurrence.  Clinically, the patient appears to be doing okay.  Based upon his lung cancer history and chronic fibrosis, I strongly recommended that he get vaccinated against COVID.  Based upon his weakened pulmonary reserve, his mortality risk would be extremely high if he contracts this virus.  Otherwise, I will see him back in another 6 months, with a chest x-ray being done on the day of his next visit for his continued lung cancer surveillance.  The patient understands all the plans  discussed today and is in agreement with them.      Zahniya Zellars Macarthur Critchley, MD

## 2020-08-01 ENCOUNTER — Other Ambulatory Visit: Payer: Self-pay

## 2020-08-01 ENCOUNTER — Telehealth: Payer: Self-pay | Admitting: Oncology

## 2020-08-01 ENCOUNTER — Encounter: Payer: Self-pay | Admitting: Oncology

## 2020-08-01 ENCOUNTER — Other Ambulatory Visit: Payer: Self-pay | Admitting: Oncology

## 2020-08-01 ENCOUNTER — Inpatient Hospital Stay: Payer: Medicare HMO

## 2020-08-01 ENCOUNTER — Inpatient Hospital Stay: Payer: Medicare HMO | Attending: Oncology | Admitting: Oncology

## 2020-08-01 VITALS — BP 147/74 | HR 80 | Temp 97.5°F | Resp 16 | Ht 68.0 in | Wt 163.9 lb

## 2020-08-01 DIAGNOSIS — C3432 Malignant neoplasm of lower lobe, left bronchus or lung: Secondary | ICD-10-CM

## 2020-08-01 NOTE — Telephone Encounter (Signed)
Per 1/28 los next appt sched and given to patient.Chest xray order also given to patient for 01/29/21.

## 2021-01-29 ENCOUNTER — Ambulatory Visit: Payer: Medicare HMO | Admitting: Oncology

## 2021-01-29 ENCOUNTER — Other Ambulatory Visit: Payer: Medicare HMO

## 2021-02-12 NOTE — Progress Notes (Signed)
[facility]  [address] [place],  [place]  [address] [phone]  Clinic Day:  02/19/2021  Referring physician: Darrol Jump  This document serves as a record of services personally performed by Marice Potter, MD. It was created on their behalf by Curry,Lauren E, a trained medical scribe. The creation of this record is based on the scribe's personal observations and the provider's statements to them.  HISTORY OF PRESENT ILLNESS:  The patient is a 80 y.o. 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  He underwent stereotactic radiation in March 2018 as his poor lung health precluded him from undergoing lung surgery.  Prior radiographic studies have shown no evidence of disease recurrence.  He comes in today for routine follow-up, as well as to review his chest x-ray done today.  Since his last visit, patient has been doing okay.  He has chronic issues with breathing as it pertains to his underlying pulmonary fibrosis/pneumonitis.  However, he denies having worsening shortness of breath, hemoptysis or any new respiratory symptoms which concern him for recurrent lung cancer.  PHYSICAL EXAM:  Blood pressure 137/74, pulse 76, temperature 97.6 F (36.4 C), resp. rate 16, height 5\' 8"  (1.727 m), weight 167 lb 6.4 oz (75.9 kg), SpO2 90 %. Wt Readings from Last 3 Encounters:  02/19/21 167 lb 6.4 oz (75.9 kg)  08/01/20 163 lb 14.4 oz (74.3 kg)  10/12/16 208 lb 12.8 oz (94.7 kg)   Body mass index is 25.45 kg/m. Performance status (ECOG): 2 Physical Exam Constitutional:      Appearance: Normal appearance. He is not ill-appearing.  HENT:     Mouth/Throat:     Mouth: Mucous membranes are moist.     Pharynx: Oropharynx is clear. No oropharyngeal exudate or posterior oropharyngeal erythema.  Cardiovascular:     Rate and Rhythm: Normal rate and regular rhythm.     Heart sounds: No murmur heard.   No friction rub. No  gallop.  Pulmonary:     Effort: Pulmonary effort is normal. No respiratory distress.     Breath sounds: Normal breath sounds. No wheezing, rhonchi or rales.  Abdominal:     General: Bowel sounds are normal. There is no distension.     Palpations: Abdomen is soft. There is no mass.     Tenderness: There is no abdominal tenderness.  Musculoskeletal:        General: No swelling.     Right lower leg: No edema.     Left lower leg: No edema.  Lymphadenopathy:     Cervical: No cervical adenopathy.     Upper Body:     Right upper body: No supraclavicular or axillary adenopathy.     Left upper body: No supraclavicular or axillary adenopathy.     Lower Body: No right inguinal adenopathy. No left inguinal adenopathy.  Skin:    General: Skin is warm.     Coloration: Skin is not jaundiced.     Findings: No lesion or rash.  Neurological:     General: No focal deficit present.     Mental Status: He is alert and oriented to person, place, and time. Mental status is at baseline.     Cranial Nerves: Cranial nerves are intact.  Psychiatric:        Mood and Affect: Mood normal.        Behavior: Behavior normal.        Thought Content: Thought  content normal.   STUDIES:  His chest x-ray done today revealed the following:   FINDINGS: Normal heart size and pulmonary vascularity.  Slight prominence of LEFT hilum unchanged.  Large hiatal hernia.  Severe chronic interstitial lung disease changes throughout both lungs, peripheral and basilar predominance. Posterior lower lobe lung mass identified by prior CT exam is poorly visualized due to degree of interstitial lung disease, grossly unchanged.  No pleural effusion or pneumothorax.  Bones demineralized.  No definite osseous or pulmonary metastatic lesions identified.  IMPRESSION: Large hiatal hernia.  Severe chronic interstitial lung disease changes and known LEFT lower lobe neoplasm.  No new abnormalities.  ASSESSMENT & PLAN:   Assessment/Plan:  A 80 y.o. 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  In clinic today, I went over his chest x-ray with him, for which he can see there remains no evidence of disease recurrence.  Clinically, the patient appears to be doing okay.  I will see him back in March 2023, with a chest CT being done a day before his next visit for his continued lung cancer surveillance.  If his next chest CT comes back negative, it will mark him being 5 years cancer free, for which I would consider him cured of his lung caner.  The patient understands all the plans discussed today and is in agreement with them.     I, Rita Ohara, am acting as scribe for Marice Potter, MD    I have reviewed this report as typed by the medical scribe, and it is complete and accurate.  Dequincy Macarthur Critchley, MD

## 2021-02-19 ENCOUNTER — Inpatient Hospital Stay: Payer: Medicare HMO | Attending: Oncology

## 2021-02-19 ENCOUNTER — Encounter: Payer: Self-pay | Admitting: Oncology

## 2021-02-19 ENCOUNTER — Other Ambulatory Visit: Payer: Self-pay

## 2021-02-19 ENCOUNTER — Telehealth: Payer: Self-pay | Admitting: Oncology

## 2021-02-19 ENCOUNTER — Other Ambulatory Visit: Payer: Self-pay | Admitting: Oncology

## 2021-02-19 ENCOUNTER — Inpatient Hospital Stay (INDEPENDENT_AMBULATORY_CARE_PROVIDER_SITE_OTHER): Payer: Medicare HMO | Admitting: Oncology

## 2021-02-19 VITALS — BP 137/74 | HR 76 | Temp 97.6°F | Resp 16 | Ht 68.0 in | Wt 167.4 lb

## 2021-02-19 DIAGNOSIS — C3432 Malignant neoplasm of lower lobe, left bronchus or lung: Secondary | ICD-10-CM

## 2021-02-19 LAB — CBC AND DIFFERENTIAL
HCT: 47 (ref 41–53)
Hemoglobin: 15.6 (ref 13.5–17.5)
Neutrophils Absolute: 4.21
Platelets: 244 (ref 150–399)
WBC: 7.8

## 2021-02-19 LAB — HEPATIC FUNCTION PANEL
ALT: 11 (ref 10–40)
AST: 23 (ref 14–40)
Alkaline Phosphatase: 81 (ref 25–125)
Bilirubin, Total: 0.6

## 2021-02-19 LAB — BASIC METABOLIC PANEL
BUN: 15 (ref 4–21)
CO2: 29 — AB (ref 13–22)
Chloride: 104 (ref 99–108)
Creatinine: 1.1 (ref 0.6–1.3)
Glucose: 95
Potassium: 4.2 (ref 3.4–5.3)
Sodium: 141 (ref 137–147)

## 2021-02-19 LAB — COMPREHENSIVE METABOLIC PANEL
Albumin: 4.3 (ref 3.5–5.0)
Calcium: 9.2 (ref 8.7–10.7)

## 2021-02-19 LAB — CBC: RBC: 4.91 (ref 3.87–5.11)

## 2021-02-19 NOTE — Telephone Encounter (Signed)
Per 8/18 LOS next appt scheduled and given to patient

## 2021-02-25 ENCOUNTER — Encounter: Payer: Self-pay | Admitting: Oncology

## 2021-07-28 ENCOUNTER — Telehealth: Payer: Self-pay | Admitting: Oncology

## 2021-07-28 NOTE — Telephone Encounter (Signed)
Patient has been notified of upcoming CT Scan appt scheduled for 09/04/21 @ 2 pm ; Check in @ 1:30.

## 2021-09-02 ENCOUNTER — Encounter: Payer: Self-pay | Admitting: Oncology

## 2021-09-04 ENCOUNTER — Encounter: Payer: Self-pay | Admitting: Oncology

## 2021-09-10 NOTE — Progress Notes (Incomplete)
[facility]  [address] [place],  [place]  [address] [phone]  Clinic Day:  09/10/2021  Referring physician: Darrol Jump  This document serves as a record of services personally performed by Marice Potter, MD. It was created on their behalf by Curry,Lauren E, a trained medical scribe. The creation of this record is based on the scribe's personal observations and the provider's statements to them.  HISTORY OF PRESENT ILLNESS:  The patient is a 81 y.o. 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  He underwent stereotactic radiation in March 2018 as his poor lung health precluded him from undergoing lung surgery.  Prior radiographic studies have shown no evidence of disease recurrence.  He comes in today for routine follow-up, as well as to review his chest CT.  Since his last visit, patient has been doing okay.  He has chronic issues with breathing as it pertains to his underlying pulmonary fibrosis/pneumonitis.  However, he denies having worsening shortness of breath, hemoptysis or any new respiratory symptoms which concern him for recurrent lung cancer.  PHYSICAL EXAM:  There were no vitals taken for this visit. Wt Readings from Last 3 Encounters:  02/19/21 167 lb 6.4 oz (75.9 kg)  08/01/20 163 lb 14.4 oz (74.3 kg)  10/12/16 208 lb 12.8 oz (94.7 kg)   There is no height or weight on file to calculate BMI. Performance status (ECOG): 2 Physical Exam Constitutional:      Appearance: Normal appearance. He is not ill-appearing.  HENT:     Mouth/Throat:     Mouth: Mucous membranes are moist.     Pharynx: Oropharynx is clear. No oropharyngeal exudate or posterior oropharyngeal erythema.  Cardiovascular:     Rate and Rhythm: Normal rate and regular rhythm.     Heart sounds: No murmur heard.   No friction rub. No gallop.  Pulmonary:     Effort: Pulmonary effort is normal. No respiratory distress.      Breath sounds: Normal breath sounds. No wheezing, rhonchi or rales.  Abdominal:     General: Bowel sounds are normal. There is no distension.     Palpations: Abdomen is soft. There is no mass.     Tenderness: There is no abdominal tenderness.  Musculoskeletal:        General: No swelling.     Right lower leg: No edema.     Left lower leg: No edema.  Lymphadenopathy:     Cervical: No cervical adenopathy.     Upper Body:     Right upper body: No supraclavicular or axillary adenopathy.     Left upper body: No supraclavicular or axillary adenopathy.     Lower Body: No right inguinal adenopathy. No left inguinal adenopathy.  Skin:    General: Skin is warm.     Coloration: Skin is not jaundiced.     Findings: No lesion or rash.  Neurological:     General: No focal deficit present.     Mental Status: He is alert and oriented to person, place, and time. Mental status is at baseline.  Psychiatric:        Mood and Affect: Mood normal.        Behavior: Behavior normal.        Thought Content: Thought content normal.   STUDIES:   Recent CT chest has revealed the following: ***  ASSESSMENT & PLAN:  Assessment/Plan:  A 81 y.o. 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  In clinic today, I went over his chest x-ray with him, for which he can see there remains no evidence of disease recurrence.  Clinically, the patient appears to be doing okay.  I will see him back in March 2023, with a chest CT being done a day before his next visit for his continued lung cancer surveillance.  If his next chest CT comes back negative, it will mark him being 5 years cancer free, for which I would consider him cured of his lung caner.  The patient understands all the plans discussed today and is in agreement with them.     I, Rita Ohara, am acting as scribe for Marice Potter, MD    I have reviewed this report as typed by  the medical scribe, and it is complete and accurate.  Dequincy Macarthur Critchley, MD

## 2021-09-18 ENCOUNTER — Inpatient Hospital Stay: Payer: Medicare Other | Attending: Oncology | Admitting: Oncology

## 2021-09-18 ENCOUNTER — Other Ambulatory Visit: Payer: Self-pay | Admitting: Oncology

## 2021-09-18 VITALS — BP 128/65 | HR 90 | Temp 97.5°F | Resp 20 | Ht 68.0 in | Wt 162.6 lb

## 2021-09-18 DIAGNOSIS — C3432 Malignant neoplasm of lower lobe, left bronchus or lung: Secondary | ICD-10-CM | POA: Diagnosis not present

## 2021-09-23 NOTE — Progress Notes (Signed)
?[facility]  ?[address] ?[place],  [place]  [address] ?[phone] ? ?Clinic Day:  09/24/2021 ? ?Referring physician: Darrol Jump ? ?HISTORY OF PRESENT ILLNESS:  ?The patient is a 81 y.o. 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  He underwent stereotactic radiation in March 2018 as his poor lung health precluded him from undergoing lung surgery.  Prior radiographic studies have shown no evidence of disease recurrence.  However, recent chest CT showed increased fullness in his left lower lobe that was worrisome for local disease recurrence, particularly as this was the same site where his previous lung cancer underwent stereotactic radiation.  Based upon this recent finding, he underwent a PET scan for further evaluation.  He comes in today to determine if the consolidation in his left lower lobe appears to be hypermetabolic and consistent with disease recurrence.  Overall, the patient continues to do well.  He denies having any new respiratory symptoms which concern him for overt signs of disease recurrence.   ? ?PHYSICAL EXAM:  ?Blood pressure 136/74, pulse 88, temperature (!) 97.5 ?F (36.4 ?C), resp. rate 20, height 5\' 8"  (1.727 m), weight 160 lb 1.6 oz (72.6 kg), SpO2 90 %. ?Wt Readings from Last 3 Encounters:  ?09/24/21 160 lb 1.6 oz (72.6 kg)  ?09/18/21 162 lb 9.6 oz (73.8 kg)  ?02/19/21 167 lb 6.4 oz (75.9 kg)  ? ?Body mass index is 24.34 kg/m?Marland Kitchen ?Performance status (ECOG): 2 ?Physical Exam ?Constitutional:   ?   Appearance: He is ill-appearing.  ?   Comments: Chronically ill-appearing gentleman who is wearing oxygen per nasal cannula  ?HENT:  ?   Mouth/Throat:  ?   Mouth: Mucous membranes are moist.  ?   Pharynx: Oropharynx is clear. No oropharyngeal exudate or posterior oropharyngeal erythema.  ?Cardiovascular:  ?   Rate and Rhythm: Normal rate and regular rhythm.  ?   Heart sounds: No murmur heard. ?  No friction rub. No  gallop.  ?Pulmonary:  ?   Effort: Pulmonary effort is normal. No respiratory distress.  ?   Breath sounds: Decreased air movement present. Decreased breath sounds present. No wheezing, rhonchi or rales.  ?Abdominal:  ?   General: Bowel sounds are normal. There is no distension.  ?   Palpations: Abdomen is soft. There is no mass.  ?   Tenderness: There is no abdominal tenderness.  ?Musculoskeletal:     ?   General: No swelling.  ?   Right lower leg: No edema.  ?   Left lower leg: No edema.  ?Lymphadenopathy:  ?   Cervical: No cervical adenopathy.  ?   Upper Body:  ?   Right upper body: No supraclavicular or axillary adenopathy.  ?   Left upper body: No supraclavicular or axillary adenopathy.  ?   Lower Body: No right inguinal adenopathy. No left inguinal adenopathy.  ?Skin: ?   General: Skin is warm.  ?   Coloration: Skin is not jaundiced.  ?   Findings: No lesion or rash.  ?Neurological:  ?   General: No focal deficit present.  ?   Mental Status: He is alert and oriented to person, place, and time. Mental status is at baseline.  ?Psychiatric:     ?   Mood and Affect: Mood normal.     ?   Behavior: Behavior normal.     ?   Thought Content: Thought content normal.  ? ?  STUDIES:  ?His PET scan revealed the following: ? ?FINDINGS: ?Mediastinal blood pool activity: SUV max 2.2 ? ?Liver activity: SUV max NA ? ?NECK: No areas of abnormal hypermetabolism. ? ?Incidental CT findings: No cervical adenopathy. ? ?CHEST: No thoracic nodal hypermetabolism. A focal area of ?pleural-based inferior right thoracic hypermetabolism posteriorly is ?without well-defined pulmonary correlate, including at a S.U.V. max ?of 3.9 on 62/2. ? ?The area of nodular consolidation within the inferior left lower ?lobe corresponds to hypermetabolism. Example at 4.0 x 3.3 cm and a ?S.U.V. max of 7.2 on 63/2. ? ?Incidental CT findings: Deferred to recent diagnostic CT. Hiatal ?hernia. Aortic and coronary artery calcification. Pulmonary artery ?enlargement  again identified. Interstitial lung disease. No ?superimposed lobar consolidation. ? ?ABDOMEN/PELVIS: Misregistration between upper abdominal PET and CT ?images. Given this factor, there are at least 2 foci of hepatic mild ?hypermetabolism which are without definite CT correlate. Example ?about the superior right hepatic lobe at a S.U.V. max of 5.1 on ?77/2. More inferiorly within the right hepatic lobe posteriorly at a ?S.U.V. max of 4.0 on 90/2. ? ?No abdominopelvic nodal hypermetabolism. ? ?Incidental CT findings: Motion degradation. Normal adrenal glands. ?Mild renal cortical thinning bilaterally. Bilateral small bowel ?containing inguinal hernias, without obstruction or strangulation. ?Prostatectomy. ? ?SKELETON: A focus of left femoral head hypermetabolism is without CT ?correlate at a S.U.V. max of 3.0. ? ?Incidental CT findings: Nonacute left eighth and ninth rib fractures ?are likely radiation induced. ? ?IMPRESSION: ?1. Hypermetabolism corresponding to the left lower lobe area of ?nodular consolidation, suspicious for local recurrence. ?2. No thoracic nodal metastasis. ?3. Motion and misregistration artifact within the abdomen. At least ?2 foci of mild hepatic hypermetabolism, without gross CT correlate. ?Consider dedicated pre and post contrast abdominal MRI or CT. Given ?the appearance of the patient's lungs, he may not be a good MRI ?candidate. ?4. Left femoral head hypermetabolism, favored to be physiologic ?given absence of CT correlate. Recommend attention on follow-up. ?5. Right pleural-based focal mild hypermetabolism is also favored to ?be incidental. ?6. Incidental findings, including: Hiatal hernia. Aortic ?atherosclerosis (ICD10-I70.0), coronary artery atherosclerosis. ?Interstitial lung disease. ? ?ASSESSMENT & PLAN:  ?Assessment/Plan:  An 81 y.o. 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  In clinic today, I  went over his PET scan images with him, for which he could see the consolidation seen on his recent chest CT is hypermetabolic in nature.  This is very concerning for local disease recurrence.  I will have him see radiation oncology in [facility] to discuss whether additional stereotactic radiation can be given to this area for disease management.  As mentioned previously, this  modality was given 5 years ago, which he tolerated very well.  Overall, the patient appears to be doing okay.  I will see him back in 2 months, primarily to reassess how he is doing after another potential course of stereotactic radiation.  The patient and his wife understand all the plans discussed today and are in agreement with them.   ? ?Joseph Geier Macarthur Critchley, MD ? ? ?  ?

## 2021-09-24 ENCOUNTER — Inpatient Hospital Stay (INDEPENDENT_AMBULATORY_CARE_PROVIDER_SITE_OTHER): Payer: Medicare Other | Admitting: Oncology

## 2021-09-24 ENCOUNTER — Other Ambulatory Visit: Payer: Self-pay

## 2021-09-24 ENCOUNTER — Other Ambulatory Visit: Payer: Self-pay | Admitting: Radiation Oncology

## 2021-09-24 ENCOUNTER — Ambulatory Visit
Admission: RE | Admit: 2021-09-24 | Discharge: 2021-09-24 | Disposition: A | Discharge status: Home / Self Care | Payer: Self-pay | Source: Ambulatory Visit | Attending: Radiation Oncology | Admitting: Radiation Oncology

## 2021-09-24 VITALS — BP 136/74 | HR 88 | Temp 97.5°F | Resp 20 | Ht 68.0 in | Wt 160.1 lb

## 2021-09-24 DIAGNOSIS — C3432 Malignant neoplasm of lower lobe, left bronchus or lung: Secondary | ICD-10-CM

## 2021-09-24 NOTE — Progress Notes (Signed)
Spoke with Hinton Dyer and she transferred me to [employer] in [facility] [place].  I left a message on Shirley's VM. ? ?

## 2021-09-28 NOTE — Progress Notes (Signed)
?Radiation Oncology         [phone] ?________________________________ ? ?Name: [facility] MRN: [id number]  ?Date: 09/30/2021  DOB: 12-08-1940 ? ?CX:KGYJEHUD, Rosana Fret, Dequincy Loni Muse, MD    ? ?REFERRING PHYSICIAN: Marice Potter, MD ? ? ?DIAGNOSIS: The encounter diagnosis was Malignant neoplasm of bronchus of left lower lobe ([place]).  ? ? ?HISTORY OF PRESENT ILLNESS: [facility] House is a 81 y.o. 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 He was found during work up of shortness of breath to have a early stage nodule biopsied in January 2018 that confirmed poorly differentiated carcinoma.  Given O2 dependence from a history of pulmonary fibrosis he underwent SBRT. ? ?He has been followed since with Dr. Bobby Rumpf in [facility]. He has been without disease, but recently a CT chest showed progressive change in the LLL. This was followed by a PET scan on 09/22/21 that showed 4 cm of fibrotic change in the LLL nodule with an SUV of 7.9. Misregistration of the PET and CT were also noted with focal hypermetabolism in the liver, left femoral head, and right pleural space. Given the findings in the LLL, he's seen to consider re-irradiation. It appears that Dr. Bobby Rumpf plans to follow these other sites.  ? ?PREVIOUS RADIATION THERAPY:   ? ?08/24/16-09/03/16  ?SBRT Treatment: ?Left lower lobe lung/ 50 Gy in 5 fractions ? ? ?PAST MEDICAL HISTORY:  ?Past Medical History:  ?Diagnosis Date  ? Emphysema of lung ([place])   ? GERD (gastroesophageal reflux disease)   ? Lung cancer ([place]) 07/13/2016  ? Left Lower Lobe  ? Prostate cancer ([place]) 2015  ? Prostatectomy  ? Pulmonary fibrosis ([place])   ?   ? ? ?PAST SURGICAL HISTORY: ?Past Surgical History:  ?Procedure Laterality Date  ? HERNIA REPAIR    ? prostatetectomy  2015  ? ? ? ? ?FAMILY HISTORY:  ?Family History  ?Problem Relation Age of Onset  ? Colon cancer Mother   ? ? ? ?SOCIAL HISTORY:  reports  that he has quit smoking. His smoking use included cigarettes. He has a 80.00 pack-year smoking history. He quit smokeless tobacco use about 5 years ago.  His smokeless tobacco use included chew. He reports that he does not drink alcohol and does not use drugs. The patient is single but has a lady friend who accompanies him as well as his daughter today. He is retired from working in [employer] and lives in [place], [place] ? ? ?ALLERGIES: Patient has no known allergies. ? ? ?MEDICATIONS:  ?Current Outpatient Medications  ?Medication Sig Dispense Refill  ? pantoprazole (PROTONIX) 20 MG tablet Take 20 mg by mouth daily.    ? ?No current facility-administered medications for this encounter.  ? ? ? ?REVIEW OF SYSTEMS: On review of systems, the patient reports that he is doing well overall. He is sometimes compliant with his oxygen use at 2L [place]. He does have shortness of breath at baseline from his pulmonary fibrosis when he is exerting himself but denies shortness of breath at rest today. He denies any hemoptysis, and only has clear productive cough occasionally. Weight has been stable, and no new complaints of shortness of breath or chest pain are noted. No other pain complaints elsewhere are noted.  ? ?PHYSICAL EXAM:  ?Wt Readings from Last 3 Encounters:  ?09/30/21 160 lb 8 oz (72.8 kg)  ?09/24/21 160 lb 1.6 oz (72.6  kg)  ?09/18/21 162 lb 9.6 oz (73.8 kg)  ? ?Temp Readings from Last 3 Encounters:  ?09/30/21 (!) 97.2 ?F (36.2 ?C) (Temporal)  ?09/24/21 (!) 97.5 ?F (36.4 ?C)  ?09/18/21 (!) 97.5 ?F (36.4 ?C)  ? ?BP Readings from Last 3 Encounters:  ?09/30/21 131/75  ?09/24/21 136/74  ?09/18/21 128/65  ? ?Pulse Readings from Last 3 Encounters:  ?09/30/21 77  ?09/24/21 88  ?09/18/21 90  ? ?In general this is a well appearing caucasian male in no acute distress. He is on O2 via [place] at 2L with his portable compressor. He's alert and oriented x4 and appropriate throughout the examination though hard of  hearing. Cardiopulmonary assessment is negative for acute distress and he exhibits normal effort.   ? ? ?ECOG = 1 ? ?0 - Asymptomatic (Fully active, able to carry on all predisease activities without restriction) ? ?1 - Symptomatic but completely ambulatory (Restricted in physically strenuous activity but ambulatory and able to carry out work of a light or sedentary nature. For example, light housework, office work) ? ?2 - Symptomatic, <50% in bed during the day (Ambulatory and capable of all self care but unable to carry out any work activities. Up and about more than 50% of waking hours) ? ?3 - Symptomatic, >50% in bed, but not bedbound (Capable of only limited self-care, confined to bed or chair 50% or more of waking hours) ? ?4 - Bedbound (Completely disabled. Cannot carry on any self-care. Totally confined to bed or chair) ? ?5 - Death ? ? Oken MM, Creech RH, Tormey DC, et al. [phone]). "Toxicity and response criteria of the [facility]". [place] Oncol. 5 (6): 649-55 ? ? ? ?LABORATORY DATA:  ?Lab Results  ?Component Value Date  ? WBC 7.8 02/19/2021  ? HGB 15.6 02/19/2021  ? HCT 47 02/19/2021  ? PLT 244 02/19/2021  ? ?Lab Results  ?Component Value Date  ? NA 141 02/19/2021  ? K 4.2 02/19/2021  ? CL 104 02/19/2021  ? CO2 29 (A) 02/19/2021  ? ?Lab Results  ?Component Value Date  ? ALT 11 02/19/2021  ? AST 23 02/19/2021  ? ALKPHOS 81 02/19/2021  ? ?  ? ?RADIOGRAPHY: No results found. ?   ? ?IMPRESSION/PLAN: ?1. Stage IA, T1b, N0, M0, NSCLC, poorly differentiated carcinoma of the LLL of the lung with concerns for local recurrence. Dr. Lisbeth Renshaw discusses the pathology findings and reviews the nature of locally recurrent early stage disease. Dr. Lisbeth Renshaw recommends tissue biopsy of the LLL prior to proceeding with re-irradiation given the possible long term risks. We discussed the risks, benefits, short, and long term effects of radiotherapy, as well as the curative intent. We will contact his  pulmonologist to see what he thinks is the best way to proceed since the patient is interested in proceeding with re-irradiation. Dr. Lisbeth Renshaw discusses the delivery and logistics of radiotherapy and anticipates a course of less than 10  fractions of radiotherapy, hopefully still SBRT style therapy or if not ultrahypofractionated course.  Written consent is obtained and placed in the chart, a copy was provided to the patient. The patient will be contacted to coordinate treatment planning by our simulation department.  ? ? ?In a visit lasting 45 minutes, greater than 50% of the time was spent face to face discussing the patient's condition, in preparation for the discussion, and coordinating the patient's care.  ? ?The above documentation reflects my direct findings during this shared patient visit. Please see the  separate note by Dr. Lisbeth Renshaw on this date for the remainder of the patient's plan of care. ? ? ? ? ?Carola Rhine, PAC ? ? ? Addendum: ?I spoke with Dr. Alcide Clever and he feels that the patient's baseline lung function is stable for biopsy. He did recommend IR biopsy rather than navigational approach given the location of his abnormality and feels risks of bronchoscopy would be higher than CT biopsy. ? ?I spoke with Dr. Ronny Bacon in IR and he reviewed the PET scan and was more concerned about the findings in the liver and left femur and recommends diagnostic imaging with CT abdomen with IV contrast. I'll also order this of the pelvis due to his questionable femur lesion. I spoke with his partner and she is in agreement. If his liver is not suspicious, we would proceed with biopsy of the LLL with Dr. Pascal Lux, or if the liver is suspicious, he would recommend U/S Biopsy. She is in agreement with this plan. ? ? ? ? ?Carola Rhine, PAC  ?

## 2021-09-28 NOTE — Progress Notes (Signed)
Thoracic Location of Tumor / Histology: Left Lower Lobe Lung ? ?Patient presented back in 2017 with complaints of SOB.  He was treated with SBRT.  He has been followed with routine scans and was noted to have progressive change in the left lower lobe. ? ?PET 09/22/2021: Hypermetabolism corresponding to the left lower lobe area of  nodular consolidation, suspicious for local recurrence.  ?No thoracic nodal metastasis.  Motion and misregistration artifact within the abdomen. At least 2 foci of mild hepatic hypermetabolism, without gross CT correlate. ? ? ?Biopsies of  ? ? ?Tobacco/Marijuana/Snuff/ETOH use: Former Smoker ? ?Past/Anticipated interventions by cardiothoracic surgery, if any:  ? ?Past/Anticipated interventions by medical oncology, if any:  ?Dr. Bobby Rumpf 09/24/2021 ?-I went over his PET scan images with him, for which he could see the consolidation seen on his recent chest CT is hypermetabolic in nature. ?-This is very concerning for local disease recurrence.   ?-I will have him see radiation oncology in [facility] to discuss whether additional stereotactic radiation can be given to this area for disease management.  As mentioned previously, this  modality was given 5 years ago, which he tolerated very well.   ?-Overall, the patient appears to be doing okay.  I will see him back in 2 months, primarily to reassess how he is doing after another potential course of stereotactic radiation.  ? ? ? ?Signs/Symptoms ?Weight changes, if any: Stable ?Respiratory complaints, if any: Stable. ?Hemoptysis, if any: Occasional productive cough with clear phlegm. ?Pain issues, if any:  No ? ?SAFETY ISSUES: ?Prior radiation? 08/24/16-09/03/16 SBRT Treatment: Left lower lobe lung/ 50 Gy in 5 fractions ?Pacemaker/ICD?  No ?Possible current pregnancy? N/a ?Is the patient on methotrexate? No ? ?Current Complaints / other details:   ?-On Oxygen 2 liters ?

## 2021-09-30 ENCOUNTER — Encounter: Payer: Self-pay | Admitting: Radiation Oncology

## 2021-09-30 ENCOUNTER — Telehealth: Payer: Self-pay | Admitting: *Deleted

## 2021-09-30 ENCOUNTER — Ambulatory Visit
Admission: RE | Admit: 2021-09-30 | Discharge: 2021-09-30 | Disposition: A | Discharge status: Home / Self Care | Payer: Medicare Other | Source: Ambulatory Visit | Attending: Radiation Oncology | Admitting: Radiation Oncology

## 2021-09-30 ENCOUNTER — Other Ambulatory Visit: Payer: Self-pay

## 2021-09-30 VITALS — BP 131/75 | HR 77 | Temp 97.2°F | Resp 18 | Ht 68.0 in | Wt 160.5 lb

## 2021-09-30 DIAGNOSIS — J439 Emphysema, unspecified: Secondary | ICD-10-CM | POA: Diagnosis not present

## 2021-09-30 DIAGNOSIS — C3432 Malignant neoplasm of lower lobe, left bronchus or lung: Secondary | ICD-10-CM | POA: Diagnosis present

## 2021-09-30 DIAGNOSIS — Z87891 Personal history of nicotine dependence: Secondary | ICD-10-CM | POA: Insufficient documentation

## 2021-09-30 DIAGNOSIS — Z9079 Acquired absence of other genital organ(s): Secondary | ICD-10-CM | POA: Diagnosis not present

## 2021-09-30 DIAGNOSIS — K219 Gastro-esophageal reflux disease without esophagitis: Secondary | ICD-10-CM | POA: Insufficient documentation

## 2021-09-30 DIAGNOSIS — Z9981 Dependence on supplemental oxygen: Secondary | ICD-10-CM | POA: Insufficient documentation

## 2021-09-30 DIAGNOSIS — Z800 Family history of malignant neoplasm of digestive organs: Secondary | ICD-10-CM | POA: Diagnosis not present

## 2021-09-30 DIAGNOSIS — Z8546 Personal history of malignant neoplasm of prostate: Secondary | ICD-10-CM | POA: Insufficient documentation

## 2021-09-30 DIAGNOSIS — Z923 Personal history of irradiation: Secondary | ICD-10-CM | POA: Insufficient documentation

## 2021-09-30 DIAGNOSIS — J8410 Pulmonary fibrosis, unspecified: Secondary | ICD-10-CM | POA: Diagnosis not present

## 2021-09-30 NOTE — Telephone Encounter (Signed)
Called patient to inform of CT for 10-08-21- arrival time- 12:30 pm @ [facility], patient to be NPO- 4 hrs, prior to test, labs to be drawn - I-STAT in Radiology, patient to pick-up contrast on 10-07-21 in Radiology, spoke with Yetta Barre (patient's friend) and she is aware of this test and its instructions ?

## 2021-10-01 ENCOUNTER — Telehealth: Payer: Self-pay | Admitting: *Deleted

## 2021-10-01 NOTE — Telephone Encounter (Signed)
CALLED PATIENT TO ASK ABOUT COMING FOR CT ON 10-03-21, SPOKE WITH PATIENT'S FRIEND, JESSIE BROWN AND SHE DECLINED DOING THE TEST ON THIS DAY, HE HAS BEEN RESCHEDULED FOR 10-08-21, NOTIFIED ALISON PERKINS ?

## 2021-10-03 ENCOUNTER — Other Ambulatory Visit (HOSPITAL_COMMUNITY): Payer: Medicare Other

## 2021-10-08 ENCOUNTER — Ambulatory Visit (HOSPITAL_COMMUNITY): Payer: Medicare Other

## 2021-10-08 ENCOUNTER — Ambulatory Visit (HOSPITAL_COMMUNITY)
Admission: RE | Admit: 2021-10-08 | Discharge: 2021-10-08 | Disposition: A | Discharge status: Home / Self Care | Payer: Medicare Other | Source: Ambulatory Visit | Attending: Radiation Oncology | Admitting: Radiation Oncology

## 2021-10-08 DIAGNOSIS — C3432 Malignant neoplasm of lower lobe, left bronchus or lung: Secondary | ICD-10-CM | POA: Diagnosis present

## 2021-10-08 LAB — POCT I-STAT CREATININE: Creatinine, Ser: 1.3 mg/dL — ABNORMAL HIGH (ref 0.61–1.24)

## 2021-10-08 MED ORDER — SODIUM CHLORIDE (PF) 0.9 % IJ SOLN
INTRAMUSCULAR | Status: AC
  Filled 2021-10-08: qty 50

## 2021-10-08 MED ORDER — IOHEXOL 300 MG/ML  SOLN
100.0000 mL | Freq: Once | INTRAMUSCULAR | Status: AC | PRN
  Administered 2021-10-08: 100 mL via INTRAVENOUS

## 2021-10-12 ENCOUNTER — Telehealth: Payer: Self-pay | Admitting: Radiation Oncology

## 2021-10-12 ENCOUNTER — Encounter: Payer: Self-pay | Admitting: *Deleted

## 2021-10-12 DIAGNOSIS — C3432 Malignant neoplasm of lower lobe, left bronchus or lung: Secondary | ICD-10-CM

## 2021-10-12 NOTE — Progress Notes (Unsigned)
Sandi Mariscal, MD  Elisabeth Pigeon, Rex Kras, PA-C ?OK for US guided liver lesion biopsy:  ? ?CT AP - 10/08/2021 - multiple lesion but largest seen on image 20, series 2; corresponding with hypermetabolic lesion seen on PET CT - 09/22/2021 - image 88, series 602.  ? ?Thx,  ?Ulice Dash  ? ?Bryson Ha - Thank you for ordering the CT AP and therefore having this pt avoid an un-necessary lung biopsy   ?  ?   ?Previous Messages ?  ?----- Message -----  ?From: Roosvelt Maser  ?Sent: 10/12/2021   7:39 AM EDT  ?To: Sandi Mariscal, MD  ?Subject: US Biopsy                                      ? ? ? ?US Biopsy  ? 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 ?Ct in computer  ? ?Hayden Pedro  ?[phone]  ?

## 2021-10-12 NOTE — Telephone Encounter (Signed)
Call took place on 10/09/21, but delay in documentation until now. The patient's CT abdomen/pelvis showed multiple lesions in the liver concerning for metastatic disease. Dr. Lisbeth Renshaw recommends liver biopsy. I'll message Dr. Pascal Lux as well whom I've spoken with previously. I also left a voicemail for Dr. Bobby Rumpf. The patient's partner Ms. Owens Shark took the call and is in agreement to proceed.  ?

## 2021-10-14 ENCOUNTER — Encounter: Payer: Self-pay | Admitting: Oncology

## 2021-10-16 ENCOUNTER — Other Ambulatory Visit: Payer: Self-pay | Admitting: Radiology

## 2021-10-19 ENCOUNTER — Encounter (HOSPITAL_COMMUNITY): Payer: Self-pay

## 2021-10-19 ENCOUNTER — Ambulatory Visit (HOSPITAL_COMMUNITY)
Admission: RE | Admit: 2021-10-19 | Discharge: 2021-10-19 | Disposition: A | Discharge status: Home / Self Care | Payer: Medicare Other | Source: Ambulatory Visit | Attending: Radiation Oncology | Admitting: Radiation Oncology

## 2021-10-19 ENCOUNTER — Other Ambulatory Visit: Payer: Self-pay

## 2021-10-19 DIAGNOSIS — C3432 Malignant neoplasm of lower lobe, left bronchus or lung: Secondary | ICD-10-CM

## 2021-10-19 DIAGNOSIS — Z8709 Personal history of other diseases of the respiratory system: Secondary | ICD-10-CM | POA: Insufficient documentation

## 2021-10-19 DIAGNOSIS — Z9981 Dependence on supplemental oxygen: Secondary | ICD-10-CM | POA: Diagnosis not present

## 2021-10-19 DIAGNOSIS — R0600 Dyspnea, unspecified: Secondary | ICD-10-CM | POA: Diagnosis not present

## 2021-10-19 DIAGNOSIS — Z923 Personal history of irradiation: Secondary | ICD-10-CM | POA: Insufficient documentation

## 2021-10-19 LAB — COMPREHENSIVE METABOLIC PANEL
ALT: 13 U/L (ref 0–44)
AST: 38 U/L (ref 15–41)
Albumin: 4 g/dL (ref 3.5–5.0)
Alkaline Phosphatase: 67 U/L (ref 38–126)
Anion gap: 7 (ref 5–15)
BUN: 18 mg/dL (ref 8–23)
CO2: 24 mmol/L (ref 22–32)
Calcium: 9.1 mg/dL (ref 8.9–10.3)
Chloride: 106 mmol/L (ref 98–111)
Creatinine, Ser: 1 mg/dL (ref 0.61–1.24)
GFR, Estimated: >60 mL/min (ref >60)
Glucose, Bld: 108 mg/dL — ABNORMAL HIGH (ref 70–99)
Potassium: 4.5 mmol/L (ref 3.5–5.1)
Sodium: 137 mmol/L (ref 135–145)
Total Bilirubin: 1.1 mg/dL (ref 0.3–1.2)
Total Protein: 7.9 g/dL (ref 6.5–8.1)

## 2021-10-19 LAB — CBC WITH DIFFERENTIAL/PLATELET
Abs Immature Granulocytes: 0.03 10*3/uL (ref 0.00–0.07)
Basophils Absolute: 0 10*3/uL (ref 0.0–0.1)
Basophils Relative: 0 %
Eosinophils Absolute: 0.1 10*3/uL (ref 0.0–0.5)
Eosinophils Relative: 1 %
HCT: 47.3 % (ref 39.0–52.0)
Hemoglobin: 15.8 g/dL (ref 13.0–17.0)
Immature Granulocytes: 0 %
Lymphocytes Relative: 40 %
Lymphs Abs: 3.7 10*3/uL (ref 0.7–4.0)
MCH: 31.7 pg (ref 26.0–34.0)
MCHC: 33.4 g/dL (ref 30.0–36.0)
MCV: 94.8 fL (ref 80.0–100.0)
Monocytes Absolute: 0.5 10*3/uL (ref 0.1–1.0)
Monocytes Relative: 5 %
Neutro Abs: 5 10*3/uL (ref 1.7–7.7)
Neutrophils Relative %: 54 %
Platelets: 214 10*3/uL (ref 150–400)
RBC: 4.99 MIL/uL (ref 4.22–5.81)
RDW: 14.6 % (ref 11.5–15.5)
WBC: 9.3 10*3/uL (ref 4.0–10.5)
nRBC: 0 % (ref 0.0–0.2)

## 2021-10-19 LAB — PROTIME-INR
INR: 1 (ref 0.8–1.2)
Prothrombin Time: 12.8 seconds (ref 11.4–15.2)

## 2021-10-19 MED ORDER — FENTANYL CITRATE (PF) 100 MCG/2ML IJ SOLN
INTRAMUSCULAR | Status: AC
  Filled 2021-10-19: qty 2

## 2021-10-19 MED ORDER — MIDAZOLAM HCL 2 MG/2ML IJ SOLN
INTRAMUSCULAR | Status: AC
  Filled 2021-10-19: qty 2

## 2021-10-19 MED ORDER — MIDAZOLAM HCL 2 MG/2ML IJ SOLN
INTRAMUSCULAR | Status: AC | PRN
  Administered 2021-10-19: .5 mg via INTRAVENOUS

## 2021-10-19 MED ORDER — LIDOCAINE HCL (PF) 1 % IJ SOLN
INTRAMUSCULAR | Status: AC | PRN
  Administered 2021-10-19: 10 mL via INTRADERMAL

## 2021-10-19 MED ORDER — FENTANYL CITRATE (PF) 100 MCG/2ML IJ SOLN
INTRAMUSCULAR | Status: AC | PRN
  Administered 2021-10-19: 25 ug via INTRAVENOUS

## 2021-10-19 MED ORDER — HYDROCODONE-ACETAMINOPHEN 5-325 MG PO TABS: 1.0000 | ORAL_TABLET | ORAL | Status: DC | PRN

## 2021-10-19 MED ORDER — SODIUM CHLORIDE 0.9 % IV SOLN: INTRAVENOUS | Status: DC

## 2021-10-19 NOTE — Procedures (Signed)
?  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 ? ?Postprocedure diagnosis: same ?EBL:    minimal ?Complications:   none immediate ? ?See full dictation in [facility]. ? ?D. Arne Cleveland MD ?Main # [phone] ?Pager  [phone] ?Mobile [phone] ?  ? ?

## 2021-10-19 NOTE — Discharge Instructions (Signed)
Please call Interventional Radiology clinic [phone] with any questions or concerns.   You may remove your dressing and shower tomorrow.   Liver Biopsy, Care After After a liver biopsy, it is common to have these things in the area where the biopsy was done. You may: Have pain. Feel sore. Have bruising. You may also feel tired for a few days. Follow these instructions at home: Medicines Take over-the-counter and prescription medicines only as told by your doctor. If you were prescribed an antibiotic medicine, take it as told by your doctor. Do not stop taking the antibiotic, even if you start to feel better. Do not take medicines that may thin your blood. These medicines include aspirin and ibuprofen. Take them only if your doctor tells you to. If told, take steps to prevent problems with pooping (constipation). You may need to: Drink enough fluid to keep your pee (urine) pale yellow. Take medicines. You will be told what medicines to take. Eat foods that are high in fiber. These include beans, whole grains, and fresh fruits and vegetables. Limit foods that are high in fat and sugar. These include fried or sweet foods. Ask your doctor if you should avoid driving or using machines while you are taking your medicine. Caring for your incision Follow instructions from your doctor about how to take care of your cut from surgery (incisions). Make sure you: Wash your hands with soap and water for at least 20 seconds before and after you change your bandage. If you cannot use soap and water, use hand sanitizer. Change your bandage. Leavestitches or skin glue in place for at least two weeks. Leave tape strips alone unless you are told to take them off. You may trim the edges of the tape strips if they curl up. Check your incision every day for signs of infection. Check for: Redness, swelling, or more pain. Fluid or blood. Warmth. Pus or a bad smell. Do not take baths, swim, or use a  hot tub. Ask your doctor about taking showers or sponge baths. Activity Rest at home for 1-2 days, or as told by your doctor. Get up to take short walks every 1 to 2 hours. Ask for help if you feel weak or unsteady. Do not lift anything that is heavier than 10 lb (4.5 kg), or the limit that you are told. Do not play contact sports for 2 weeks after the procedure. Return to your normal activities as told by your doctor. Ask what activities are safe for you. General instructions  Do not drink alcohol in the first week after the procedure. Plan to have a responsible adult care for you for the time you are told after you leave the hospital or clinic. This is important. It is up to you to get the results of your procedure. Ask how to get your results when they are ready. Keep all follow-up visits.   Contact a doctor if: You have more bleeding in your incision. Your incision swells, or is red and more painful. You have fluid that comes from your incision. You develop a rash. You have fever or chills. Get help right away if: You have swelling, bloating, or pain in your belly (abdomen). You get dizzy or faint. You vomit or you feel like vomiting. You have trouble breathing or feel short of breath. You have chest pain. You have problems talking or seeing. You have trouble with your balance or moving your arms or legs. These symptoms may be an emergency. Get help   right away. Call your local emergency services (911 in the [place] Do not wait to see if the symptoms will go away. Do not drive yourself to the hospital. Summary After the procedure, it is common to have pain, soreness, bruising, and tiredness. Your doctor will tell you how to take care of yourself at home. Change your bandage, take your medicines, and limit your activities as told by your doctor. Call your doctor if you have symptoms of infection. Get help right away if your belly swells, your cut bleeds a lot, or you have trouble  talking or breathing. This information is not intended to replace advice given to you by your health care provider. Make sure you discuss any questions you have with your healthcare provider. Document Revised: 05/05/2020 Document Reviewed: 05/05/2020 Elsevier Patient Education  2022 Elsevier Inc.     Moderate Conscious Sedation, Adult, Care After This sheet gives you information about how to care for yourself after your procedure. Your health care provider may also give you more specific instructions. If you have problems or questions, contact your health care provider. What can I expect after the procedure? After the procedure, it is common to have: Sleepiness for several hours. Impaired judgment for several hours. Difficulty with balance. Vomiting if you eat too soon. Follow these instructions at home: For the time period you were told by your health care provider: Rest. Do not participate in activities where you could fall or become injured. Do not drive or use machinery. Do not drink alcohol. Do not take sleeping pills or medicines that cause drowsiness. Do not make important decisions or sign legal documents. Do not take care of children on your own.        Eating and drinking Follow the diet recommended by your health care provider. Drink enough fluid to keep your urine pale yellow. If you vomit: Drink water, juice, or soup when you can drink without vomiting. Make sure you have little or no nausea before eating solid foods.    General instructions Take over-the-counter and prescription medicines only as told by your health care provider. Have a responsible adult stay with you for the time you are told. It is important to have someone help care for you until you are awake and alert. Do not smoke. Keep all follow-up visits as told by your health care provider. This is important. Contact a health care provider if: You are still sleepy or having trouble with balance after  24 hours. You feel light-headed. You keep feeling nauseous or you keep vomiting. You develop a rash. You have a fever. You have redness or swelling around the IV site. Get help right away if: You have trouble breathing. You have new-onset confusion at home. Summary After the procedure, it is common to feel sleepy, have impaired judgment, or feel nauseous if you eat too soon. Rest after you get home. Know the things you should not do after the procedure. Follow the diet recommended by your health care provider and drink enough fluid to keep your urine pale yellow. Get help right away if you have trouble breathing or new-onset confusion at home. This information is not intended to replace advice given to you by your health care provider. Make sure you discuss any questions you have with your health care provider. Document Revised: 10/19/2019 Document Reviewed: 05/17/2019 Elsevier Patient Education  2021 Elsevier Inc.    

## 2021-10-19 NOTE — H&P (Signed)
? ?Chief Complaint: ?Patient was seen in consultation today for No chief complaint on file. ? at the request of Hayden Pedro ? ?Referring Physician(s): ?Hayden Pedro ? ?Supervising Physician: Arne Cleveland ? ?Patient Status: [place] ? ?History of Present Illness: ?Joseph House is a 81 y.o. male  ?NSCLC of LLL ?S/P radiation ?Uses supplemental O2 ?Reoccurrence of lung lesion and finding of mutiple lesions in other sites.  Presents today for liver lesion biopsy. ? ?At baseline has some DOE from hx of pulmonary fibrosis.  Denies hemoptysis.  Endorses occasional cough, non-productive.  Denies and CP, dizziness, nausea/vomiting, abdominal pain or other complaints at this time ? ?Past Medical History:  ?Diagnosis Date  ? Emphysema of lung ([place])   ? GERD (gastroesophageal reflux disease)   ? Lung cancer ([place]) 07/13/2016  ? Left Lower Lobe  ? Prostate cancer ([place]) 2015  ? Prostatectomy  ? Pulmonary fibrosis ([place])   ? ? ?Past Surgical History:  ?Procedure Laterality Date  ? HERNIA REPAIR    ? prostatetectomy  2015  ? ? ?Allergies: ?Patient has no known allergies. ? ?Medications: ?Prior to Admission medications   ?Medication Sig Start Date End Date Taking? Authorizing Provider  ?pantoprazole (PROTONIX) 20 MG tablet Take 20 mg by mouth daily.   Yes [provider]  ?  ? ?Family History  ?Problem Relation Age of Onset  ? Colon cancer Mother   ? ? ?Social History  ? ?Socioeconomic History  ? Marital status: Widowed  ?  Spouse name: Not on file  ? Number of children: Not on file  ? Years of education: Not on file  ? Highest education level: Not on file  ?Occupational History  ? Not on file  ?Tobacco Use  ? Smoking status: Former  ?  Packs/day: 2.00  ?  Years: 40.00  ?  Pack years: 80.00  ?  Types: Cigarettes  ? Smokeless tobacco: Former  ?  Types: Chew  ?  Quit date: 08/09/2016  ?Substance and Sexual Activity  ? Alcohol use: No  ? Drug use: No  ? Sexual activity: Never  ?Other Topics  Concern  ? Not on file  ?Social History Narrative  ? Not on file  ? ?Social Determinants of Health  ? ?Financial Resource Strain: Not on file  ?Food Insecurity: Not on file  ?Transportation Needs: Not on file  ?Physical Activity: Not on file  ?Stress: Not on file  ?Social Connections: Not on file  ? ? ?Review of Systems: A 12 point ROS discussed and pertinent positives are indicated in the HPI above.  All other systems are negative. ? ? ?Vital Signs: ?BP (!) 150/90   Pulse 71   Temp 97.8 ?F (36.6 ?C) (Oral)   Resp 18   Ht 5\' 8"  (1.727 m)   Wt 160 lb 8 oz (72.8 kg)   SpO2 99%   BMI 24.40 kg/m?  ? ?Physical Exam ?Vitals reviewed.  ?HENT:  ?   Head: Normocephalic and atraumatic.  ?   Mouth/Throat:  ?   Mouth: Mucous membranes are moist.  ?   Pharynx: Oropharynx is clear.  ?Eyes:  ?   Extraocular Movements: Extraocular movements intact.  ?Cardiovascular:  ?   Rate and Rhythm: Normal rate.  ?   Pulses: Normal pulses.  ?   Heart sounds: Normal heart sounds.  ?Pulmonary:  ?   Effort: Pulmonary effort is normal.  ?   Breath sounds: Normal breath sounds.  ?Abdominal:  ?   General:  Abdomen is flat.  ?   Palpations: Abdomen is soft.  ?Skin: ?   General: Skin is warm and dry.  ?Neurological:  ?   General: No focal deficit present.  ?   Mental Status: He is alert.  ?Psychiatric:     ?   Mood and Affect: Mood normal.     ?   Behavior: Behavior normal.  ? ? ?Imaging: ?CT Abdomen Pelvis W Contrast ? ?Result Date: 10/08/2021 ?CLINICAL DATA:  Non-small cell lung cancer staging. * Tracking Code: BO * EXAM: CT ABDOMEN AND PELVIS WITH CONTRAST TECHNIQUE: Multidetector CT imaging of the abdomen and pelvis was performed using the standard protocol following bolus administration of intravenous contrast. RADIATION DOSE REDUCTION: This exam was performed according to the departmental dose-optimization program which includes automated exposure control, adjustment of the mA and/or kV according to patient size and/or use of iterative  reconstruction technique. CONTRAST:  111mL OMNIPAQUE IOHEXOL 300 MG/ML  SOLN COMPARISON:  September 22, 2021 FINDINGS: Lower chest: LEFT lower lobe pulmonary mass showing hypermetabolic features on recent PET imaging is partially visualized with heterogeneous appearance and abutting the pleura in the LEFT lung base (image 1/6) 4.5 x 2.5 cm. Additional nodularity contiguous with this area extends inferiorly also seen on previous imaging and unchanged. Septal thickening, bronchiectasis and honeycombing compatible with interstitial lung disease similar to previous imaging as well. Large hiatal hernia extends into the chest with gastric wall thickening which is diffuse. The stomach is inverted with the greater curvature directed cephalad. Hepatobiliary: Area of increased metabolic activity on the patient's PET scan corresponds to an area in the medial aspect of the liver (image 20/2) hepatic subsegment V measuring 2.7 x 2.3 cm. Numerous other lesions are present with subtle low-density near isodense to the obvious metastatic lesion described above. For instance, on image 15/2 there are 2 subcentimeter areas and a third adjacent on image 16 all measuring between 6 and 8 mm. Medial segment of the LEFT hepatic lobe also with a 5 mm lesion on image 16/2 at least 10 additional lesions in the liver. Portal vein is patent. No pericholecystic stranding. No sign of biliary duct distension. Pancreas: Normal, without mass, inflammation or ductal dilatation. Spleen: Spleen normal size and contour no focal splenic lesion. Distortion of splenic positioning secondary to large hiatal hernia and traction on gastrosplenic ligament. Adrenals/Urinary Tract: Adrenal glands are unremarkable. Symmetric renal enhancement. No sign of hydronephrosis. No suspicious renal lesion or perinephric stranding. Urinary bladder is grossly unremarkable. Urinary bladder is under distended which does limit assessment. Stomach/Bowel: Inverted stomach extending  into the chest as described. A portion of the transverse colon is also herniated into the chest. Oral contrast passes this level. Sigmoid colon is quite tortuous. No sign of bowel obstruction. Colonic diverticulosis. Appendix is normal. The cecum with slightly higher position following herniation of the transverse colon into the chest. Question mild cecal thickening and mild thickening of the distal transverse colon. Congestion of mesenteric vessels in the transverse mesocolon following herniation of a portion of the transverse colon into the chest. Adjacent 2 the tortuous sigmoid colon there is a bowel loop, mid to distal jejunum which passes from LEFT to RIGHT at the root of the mesentery just above the mobile sigmoid. Distortion of mesenteric structures is noted at this level. Oral contrast passes beyond this level as well and there is no sign of adjacent stranding. Small bowel loops pass into the RIGHT hemiabdomen in fill the RIGHT pericolic gutter inferior and lateral to  the cecum which is elevated relative to its previous position. Moderately large duodenal diverticulum similar to prior imaging. LEFT inguinal hernia containing small bowel loops without obstruction similar to prior imaging. Vascular/Lymphatic: Aortic atherosclerosis. No sign of aneurysm. Smooth contour of the IVC. There is no gastrohepatic or hepatoduodenal ligament lymphadenopathy. No retroperitoneal or mesenteric lymphadenopathy. No pelvic sidewall lymphadenopathy. Reproductive: Unremarkable following prostatectomy to the extent evaluated. Other: No ascites. Musculoskeletal: Subacute fractures of LEFT-sided ribs as on previous imaging. Spinal degenerative changes. IMPRESSION: 1. LEFT lower lobe pulmonary mass showing hypermetabolic features on recent PET imaging, consistent with known primary lung cancer. 2. Numerous other lesions in the liver, consistent with metastatic disease. 3. Interval herniation of the transverse colon into the chest  through the esophageal hiatus anterior to the stomach with mild cecal thickening and mild thickening of the colon distal to the site of herniation without signs of obstruction but with congestive changes in the adjacen

## 2021-10-21 ENCOUNTER — Telehealth: Payer: Self-pay | Admitting: Radiation Oncology

## 2021-10-21 ENCOUNTER — Telehealth: Payer: Self-pay | Admitting: *Deleted

## 2021-10-21 LAB — SURGICAL PATHOLOGY

## 2021-10-21 NOTE — Telephone Encounter (Signed)
Called patient to inform of appt. with Dr. Bobby Rumpf on 10-22-21- arrival time- 10:30 am, spoke with Allegra Lai and she is aware of this appt. ?

## 2021-10-21 NOTE — Telephone Encounter (Signed)
I called and spoke with the patient's partner Ms. 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 I've reached out to Dr. Bobby Rumpf and the patient will get back to see him to discuss systemic therapy options.  ?

## 2021-10-21 NOTE — Progress Notes (Signed)
?[facility]  ?[address] ?[place],  [place]  [address] ?[phone] ? ?Clinic Day:  10/25/2021 ? ?Referring physician: [facility]* ? ?HISTORY OF PRESENT ILLNESS:  ?The patient is a 82 y.o. 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  He underwent stereotactic radiation in March 2018 as his poor lung health precluded him from undergoing lung surgery.  In addition to his recent chest CT showing increased fullness in his left lower lobe that was worrisome for local disease recurrence, a PET scan unexpectedly showed a hypermetabolic lesions in his liver.  Based upon this finding, a liver biopsy was done to determine the possible etiology behind this.  He comes in today to go over his liver biopsy results and their implications.  Overall, the patient has been doing okay.  He denies having any new respiratory or systemic symptoms elsewhere which concern him for overt signs of disease recurrence.   ? ?PHYSICAL EXAM:  ?Blood pressure 129/71, pulse 78, temperature 97.6 ?F (36.4 ?C), resp. rate 18, height 5' 8"  (1.727 m), weight 156 lb 12.8 oz (71.1 kg), SpO2 92 %. ?Wt Readings from Last 3 Encounters:  ?10/22/21 156 lb 12.8 oz (71.1 kg)  ?10/19/21 160 lb 8 oz (72.8 kg)  ?09/30/21 160 lb 8 oz (72.8 kg)  ? ?Body mass index is 23.84 kg/m?Marland Kitchen ?Performance status (ECOG): 2 ?Physical Exam ?Constitutional:   ?   Appearance: He is ill-appearing.  ?   Comments: Chronically ill-appearing gentleman who is wearing oxygen per nasal cannula  ?HENT:  ?   Mouth/Throat:  ?   Mouth: Mucous membranes are moist.  ?   Pharynx: Oropharynx is clear. No oropharyngeal exudate or posterior oropharyngeal erythema.  ?Cardiovascular:  ?   Rate and Rhythm: Normal rate and regular rhythm.  ?   Heart sounds: No murmur heard. ?  No friction rub. No gallop.  ?Pulmonary:  ?   Effort: Pulmonary effort is normal. No respiratory distress.  ?   Breath sounds: Decreased air  movement present. Decreased breath sounds present. No wheezing, rhonchi or rales.  ?Abdominal:  ?   General: Bowel sounds are normal. There is no distension.  ?   Palpations: Abdomen is soft. There is no mass.  ?   Tenderness: There is no abdominal tenderness.  ?Musculoskeletal:     ?   General: No swelling.  ?   Right lower leg: No edema.  ?   Left lower leg: No edema.  ?Lymphadenopathy:  ?   Cervical: No cervical adenopathy.  ?   Upper Body:  ?   Right upper body: No supraclavicular or axillary adenopathy.  ?   Left upper body: No supraclavicular or axillary adenopathy.  ?   Lower Body: No right inguinal adenopathy. No left inguinal adenopathy.  ?Skin: ?   General: Skin is warm.  ?   Coloration: Skin is not jaundiced.  ?   Findings: No lesion or rash.  ?Neurological:  ?   General: No focal deficit present.  ?   Mental Status: He is alert and oriented to person, place, and time. Mental status is at baseline.  ?Psychiatric:     ?   Mood and Affect: Mood normal.     ?   Behavior: Behavior normal.     ?   Thought Content: Thought content normal.  ? ?STUDIES:  ?A. 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  See comment  ? ?COMMENT:  ?Immunohistochemical stains show that the tumor cells are positive for  ?synaptophysin, chromogranin and CD56 with patchy labeling for TTF-1.  ?Immunostains for Napsin A, p63 and CK5/6 are negative.  Stain for Ki-67  ?shows a proliferative index of about 35-40%.  This immunoprofile  ?supports the above interpretation.  Dr. Saralyn Pilar concurs with the  ?diagnosis.   ? ?ASSESSMENT & PLAN:  ?Assessment/Plan:  An 81 y.o. 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  Although the liver pathology is different from his initial squamous cell carcinoma, it now unfortunately appears this gentleman has metastatic disease  originating from his lungs.  He ultimately understands he is dealing with incurable disease.  For completeness, I will send his biopsied liver tissue to [facility] to undergo NGS testing.  The hope is some form of targeted therapy could be used to treat his disease.  In his current health, I have significant reservations as to how well he could tolerate systemic chemotherapy.  I will see this patient back in 3 weeks to go over his [facility] testing, which will be used to formulate his next course of action.  Although somewhat despondent, the patient and his wife understand all the plans discussed today and are in agreement with them. ? ?Daivik Overley Macarthur Critchley, MD ? ? ?  ?

## 2021-10-22 ENCOUNTER — Inpatient Hospital Stay (INDEPENDENT_AMBULATORY_CARE_PROVIDER_SITE_OTHER): Payer: Medicare Other | Admitting: Oncology

## 2021-10-22 ENCOUNTER — Telehealth: Payer: Self-pay | Admitting: Oncology

## 2021-10-22 ENCOUNTER — Inpatient Hospital Stay: Payer: Medicare Other | Attending: Oncology

## 2021-10-22 VITALS — BP 129/71 | HR 78 | Temp 97.6°F | Resp 18 | Ht 68.0 in | Wt 156.8 lb

## 2021-10-22 DIAGNOSIS — C3432 Malignant neoplasm of lower lobe, left bronchus or lung: Secondary | ICD-10-CM

## 2021-10-22 LAB — CBC AND DIFFERENTIAL
HCT: 45 (ref 41–53)
Hemoglobin: 14.7 (ref 13.5–17.5)
Neutrophils Absolute: 4.12
Platelets: 217 10*3/uL (ref 150–400)
WBC: 7.1

## 2021-10-22 LAB — COMPREHENSIVE METABOLIC PANEL
Albumin: 4.1 (ref 3.5–5.0)
Calcium: 9.4 (ref 8.7–10.7)

## 2021-10-22 LAB — BASIC METABOLIC PANEL
BUN: 19 (ref 4–21)
CO2: 28 — AB (ref 13–22)
Chloride: 103 (ref 99–108)
Creatinine: 1 (ref 0.6–1.3)
Glucose: 124
Potassium: 4.3 mEq/L (ref 3.5–5.1)
Sodium: 140 (ref 137–147)

## 2021-10-22 LAB — HEPATIC FUNCTION PANEL
ALT: 16 U/L (ref 10–40)
AST: 46 — AB (ref 14–40)
Alkaline Phosphatase: 86 (ref 25–125)
Bilirubin, Total: 0.8

## 2021-10-22 LAB — CBC: RBC: 4.74 (ref 3.87–5.11)

## 2021-10-22 NOTE — Telephone Encounter (Signed)
Per 10/22/21 los next appt scheduled and confirmed with patient ?

## 2021-10-28 ENCOUNTER — Encounter (HOSPITAL_COMMUNITY): Payer: Self-pay | Admitting: Oncology

## 2021-11-02 ENCOUNTER — Encounter (HOSPITAL_COMMUNITY): Payer: Self-pay | Admitting: Oncology

## 2021-11-11 NOTE — Progress Notes (Signed)
[facility]  [address] [place],  [place]  [address] [phone]  Clinic Day:  11/12/2021  Referring physician: Darrol Jump  HISTORY OF PRESENT ILLNESS:  The patient is a 81 y.o. male with metastatic non-small cell lung cancer.  A recent liver biopsy showed him to have large cell neuroendocrine carcinoma.  He comes in today to go over his [facility] test results to determine if there is any form of targeted therapy that could be used to treat his metastatic disease.  Since his last visit, the patient has been doing okay.  He remains quite dyspneic from his baseline shortness of breath from his underlying pulmonary fibrosis.  Both he and his family remain concerned as to whether he can tolerate any form of palliative chemotherapy due to his suboptimal baseline health.  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  He underwent stereotactic radiation in March 2018 as his poor lung health precluded him from undergoing lung surgery.      PHYSICAL EXAM:  Blood pressure (!) 159/81, pulse (!) 102, temperature (!) 97.5 F (36.4 C), resp. rate 20, height _0  (1.727 m), weight 152 lb 12.8 oz (69.3 kg), SpO2 93 %. Wt Readings from Last 3 Encounters:  11/12/21 152 lb 12.8 oz (69.3 kg)  10/22/21 156 lb 12.8 oz (71.1 kg)  10/19/21 160 lb 8 oz (72.8 kg)   Body mass index is 23.23 kg/m. Performance status (ECOG): 2 Physical Exam Constitutional:      Appearance: He is ill-appearing.     Comments: Chronically ill-appearing gentleman who is wearing oxygen per nasal cannula  HENT:     Mouth/Throat:     Mouth: Mucous membranes are moist.     Pharynx: Oropharynx is clear. No oropharyngeal exudate or posterior oropharyngeal erythema.  Cardiovascular:     Rate and Rhythm: Normal rate and regular rhythm.     Heart sounds: No murmur heard.   No friction rub. No gallop.   Pulmonary:     Effort: Pulmonary effort is normal. No respiratory distress.     Breath sounds: Decreased air movement present. Decreased breath sounds present. No wheezing, rhonchi or rales.  Abdominal:     General: Bowel sounds are normal. There is no distension.     Palpations: Abdomen is soft. There is no mass.     Tenderness: There is no abdominal tenderness.  Musculoskeletal:        General: No swelling.     Right lower leg: No edema.     Left lower leg: No edema.  Lymphadenopathy:     Cervical: No cervical adenopathy.     Upper Body:     Right upper body: No supraclavicular or axillary adenopathy.     Left upper body: No supraclavicular or axillary adenopathy.     Lower Body: No right inguinal adenopathy. No left inguinal adenopathy.  Skin:    General: Skin is warm.     Coloration: Skin is not jaundiced.     Findings: No lesion or rash.  Neurological:     General: No focal deficit present.     Mental Status: He is alert and oriented to person, place, and time. Mental status is at baseline.  Psychiatric:        Mood and Affect: Mood normal.        Behavior: Behavior normal.  Thought Content: Thought content normal.   STUDIES:  [facility] test results did not show any actionable mutation for which targeted therapy could be used to treat his disease.  Furthermore, his PD-L1 score came back at 0.  ASSESSMENT & PLAN:  Assessment/Plan:  An 81 y.o. male with metastatic non-small cell carcinoma, which includes a biopsy showing large cell neuroendocrine carcinoma within his liver.  In clinic today, I went over his recent tumor testing, which unfortunately showed that he does not have any actionable mutation for which some form of targeted therapy could be used.  Based upon this, I did talk to him about palliative chemotherapy in the form of carboplatin/etoposide.  This regimen is particularly effective in neuroendocrine tumors like the one he has.  Understandably, the  patient is very concerned about his ability to tolerate  palliative chemotherapy.  He wishes to have more time to think about this before proceeding.  I will tentatively see him back in 3 weeks for repeat clinical assessment.  He knows to contact the office before his next visit when he has made a decision about what he wishes to do moving forward.  The patient and his wife understand all the plans discussed today and are in agreement with them.  Nathalie Cavendish Macarthur Critchley, MD

## 2021-11-12 ENCOUNTER — Other Ambulatory Visit: Payer: Self-pay | Admitting: Hematology and Oncology

## 2021-11-12 ENCOUNTER — Inpatient Hospital Stay: Payer: Medicare Other | Attending: Oncology | Admitting: Oncology

## 2021-11-12 ENCOUNTER — Other Ambulatory Visit: Payer: Self-pay

## 2021-11-12 ENCOUNTER — Inpatient Hospital Stay: Payer: Medicare Other

## 2021-11-12 VITALS — BP 159/81 | HR 102 | Temp 97.5°F | Resp 20 | Ht 68.0 in | Wt 152.8 lb

## 2021-11-12 DIAGNOSIS — C3432 Malignant neoplasm of lower lobe, left bronchus or lung: Secondary | ICD-10-CM

## 2021-11-19 ENCOUNTER — Telehealth: Payer: Self-pay

## 2021-11-19 NOTE — Telephone Encounter (Addendum)
11/23/21- Dr Bobby Rumpf states it is up to pt if he would like to take chemotherapy. I called pt's friend,Jessie and notified her of Dr Bobby Rumpf' response. She asked pt if he wanted to try chemotherapy. I heard pt state in background, "Yes if I can". I told them I would notify Dr Bobby Rumpf and someone would be calling them with schedules, etc once chemo ordered and approved by insurance.    I notified Evelena Peat, pt's friend, that we will have to wait and talk to Dr Bobby Rumpf on Monday regarding which treatment he will order, as there aren't any markers to target. I told her we would reach back out to them on Monday. She verbalized understanding.  RE: Treatment decision Received: Today Melodye Ped, NP  Dairl Ponder, RN Yes, I just looked at his [facility] and he has no actionable markers. So, I'm not sure what [facility] will do.   Pt and family called to ask if doctor had made decision about his treatment? It looks like maybe Dr Bobby Rumpf was awaiting [facility] testing. Next f/u is in June I think. I sent above message to Rhunette Croft @ [phone] (Dr Bobby Rumpf on vacation).

## 2021-11-24 ENCOUNTER — Ambulatory Visit: Payer: Medicare Other | Admitting: Oncology

## 2021-11-24 ENCOUNTER — Other Ambulatory Visit: Payer: Medicare Other

## 2021-11-24 ENCOUNTER — Encounter: Payer: Self-pay | Admitting: Cardiology

## 2021-11-24 DIAGNOSIS — I351 Nonrheumatic aortic (valve) insufficiency: Secondary | ICD-10-CM

## 2021-11-24 DIAGNOSIS — I361 Nonrheumatic tricuspid (valve) insufficiency: Secondary | ICD-10-CM

## 2021-11-24 DIAGNOSIS — C3432 Malignant neoplasm of lower lobe, left bronchus or lung: Secondary | ICD-10-CM | POA: Diagnosis not present

## 2021-11-24 DIAGNOSIS — I272 Pulmonary hypertension, unspecified: Secondary | ICD-10-CM

## 2021-11-26 ENCOUNTER — Encounter: Payer: Self-pay | Admitting: Oncology

## 2021-12-02 ENCOUNTER — Other Ambulatory Visit: Payer: Self-pay | Admitting: Oncology

## 2021-12-03 ENCOUNTER — Ambulatory Visit: Payer: Medicare Other | Admitting: Oncology

## 2021-12-03 NOTE — Progress Notes (Incomplete)
[facility]  [address] [place],    [address] [phone]  Clinic Day:  11/12/2021  Referring physician: [facility]*  HISTORY OF PRESENT ILLNESS:  The patient is a 81 y.o. male with metastatic non-small cell lung cancer.  A recent liver biopsy showed him to have large cell neuroendocrine carcinoma.  He comes in today to go over his [facility] test results to determine if there is any form of targeted therapy that could be used to treat his metastatic disease.  Since his last visit, the patient has been doing okay.  He remains quite dyspneic from his baseline shortness of breath from his underlying pulmonary fibrosis.  Both he and his family remain concerned as to whether he can tolerate any form of palliative chemotherapy due to his suboptimal baseline health.  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  He underwent stereotactic radiation in March 2018 as his poor lung health precluded him from undergoing lung surgery.      PHYSICAL EXAM:  There were no vitals taken for this visit. Wt Readings from Last 3 Encounters:  11/12/21 152 lb 12.8 oz (69.3 kg)  10/22/21 156 lb 12.8 oz (71.1 kg)  10/19/21 160 lb 8 oz (72.8 kg)   There is no height or weight on file to calculate BMI. Performance status (ECOG): 2 Physical Exam Constitutional:      Appearance: He is ill-appearing.     Comments: Chronically ill-appearing gentleman who is wearing oxygen per nasal cannula  HENT:     Mouth/Throat:     Mouth: Mucous membranes are moist.     Pharynx: Oropharynx is clear. No oropharyngeal exudate or posterior oropharyngeal erythema.  Cardiovascular:     Rate and Rhythm: Normal rate and regular rhythm.     Heart sounds: No murmur heard.   No friction rub. No gallop.  Pulmonary:     Effort: Pulmonary effort is normal. No respiratory distress.      Breath sounds: Decreased air movement present. Decreased breath sounds present. No wheezing, rhonchi or rales.  Abdominal:     General: Bowel sounds are normal. There is no distension.     Palpations: Abdomen is soft. There is no mass.     Tenderness: There is no abdominal tenderness.  Musculoskeletal:        General: No swelling.     Right lower leg: No edema.     Left lower leg: No edema.  Lymphadenopathy:     Cervical: No cervical adenopathy.     Upper Body:     Right upper body: No supraclavicular or axillary adenopathy.     Left upper body: No supraclavicular or axillary adenopathy.     Lower Body: No right inguinal adenopathy. No left inguinal adenopathy.  Skin:    General: Skin is warm.     Coloration: Skin is not jaundiced.     Findings: No lesion or rash.  Neurological:     General: No focal deficit present.     Mental Status: He is alert and oriented to person, place, and time. Mental status is at baseline.  Psychiatric:        Mood and Affect: Mood normal.        Behavior: Behavior normal.        Thought Content: Thought content normal.   STUDIES:  [facility] test results did not show  any actionable mutation for which targeted therapy could be used to treat his disease.  Furthermore, his PD-L1 score came back at 0.  ASSESSMENT & PLAN:  Assessment/Plan:  An 81 y.o. male with metastatic non-small cell carcinoma, which includes a biopsy showing large cell neuroendocrine carcinoma within his liver.  In clinic today, I went over his recent tumor testing, which unfortunately showed that he does not have any actionable mutation for which some form of targeted therapy could be used.  Based upon this, I did talk to him about palliative chemotherapy in the form of carboplatin/etoposide.  This regimen is particularly effective in neuroendocrine tumors like the one he has.  Understandably, the patient is very concerned about his ability to tolerate  palliative chemotherapy.  He  wishes to have more time to think about this before proceeding.  I will tentatively see him back in 3 weeks for repeat clinical assessment.  He knows to contact the office before his next visit when he has made a decision about what he wishes to do moving forward.  The patient and his wife understand all the plans discussed today and are in agreement with them.  Mirabelle Cyphers Macarthur Critchley, MD

## 2022-01-02 DEATH — deceased

## 2023-05-20 IMAGING — CT CT ABD-PELV W/ CM
2 of 5 series · 12 of 46 positions shown, 14 images · IV contrast (APPLIED)
Comparison: September 22, 2021

CLINICAL DATA: Non-small cell lung cancer staging.

* Tracking Code: BO *
EXAM:
CT ABDOMEN AND PELVIS WITH CONTRAST
TECHNIQUE: Multidetector CT imaging of the abdomen and pelvis was performed
using the standard protocol following bolus administration of
intravenous contrast.

[Series 2: axial st · axial · 0.78mm/px · z∈[-332,+88]mm · 9 of 98 slices shown, 11 images]
[im 7/98  soft-tissue]
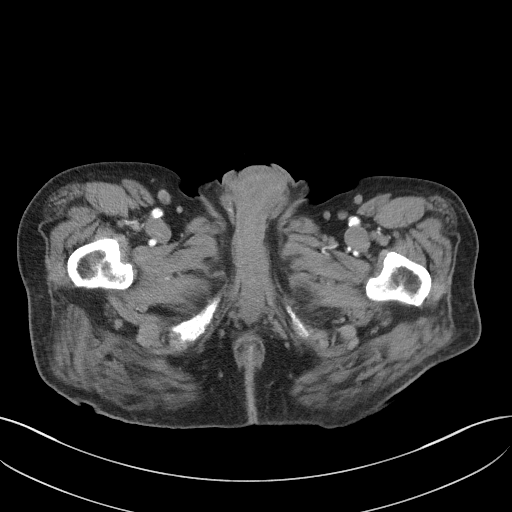
[im 7/98  bone]
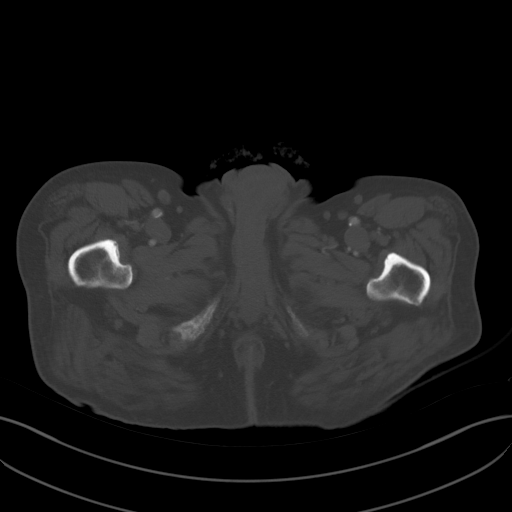
[im 19/98  soft-tissue]
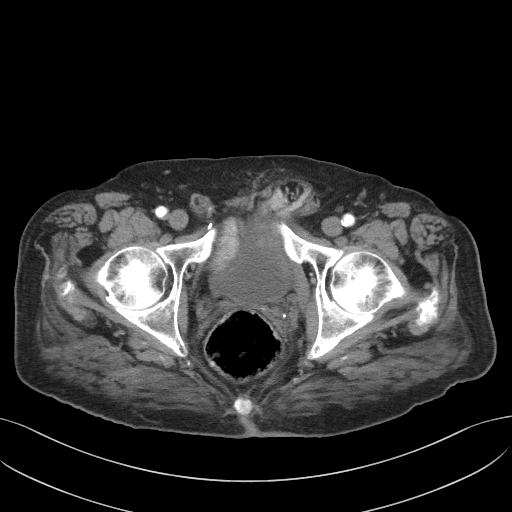
[im 31/98  soft-tissue]
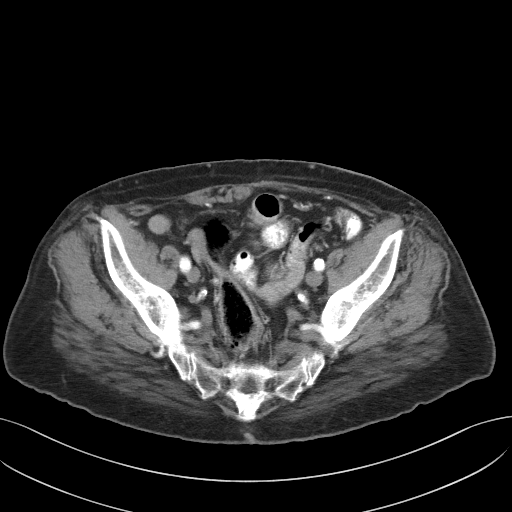
[im 37/98  soft-tissue]
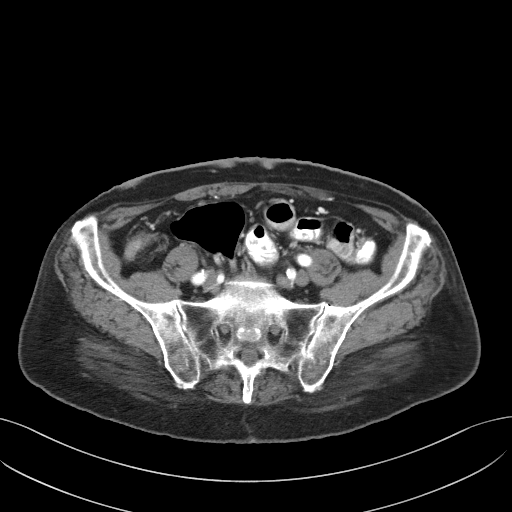
[im 49/98  soft-tissue]
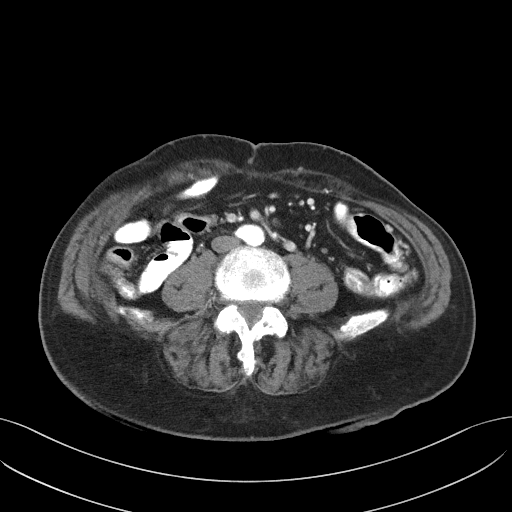
[im 61/98  soft-tissue]
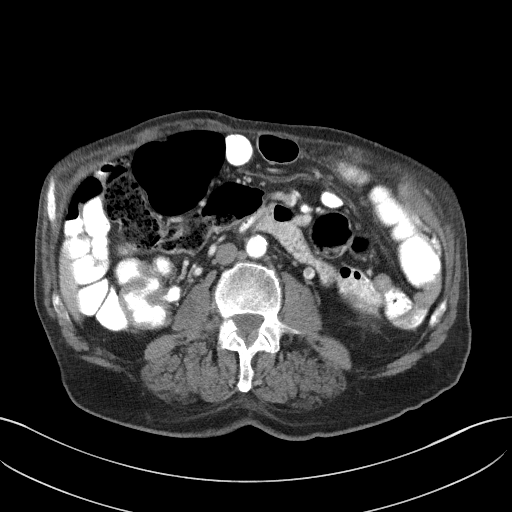
[im 67/98  soft-tissue]
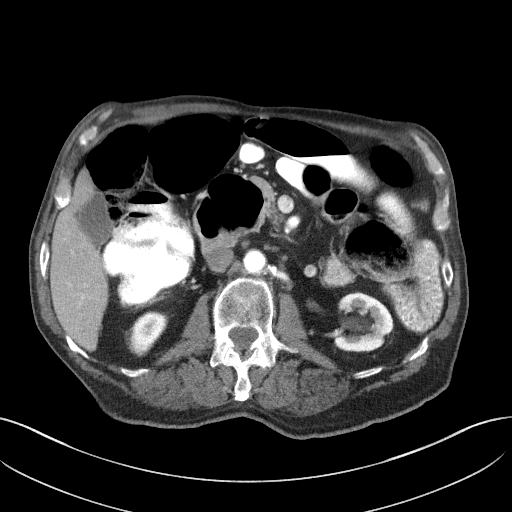
[im 79/98  soft-tissue]
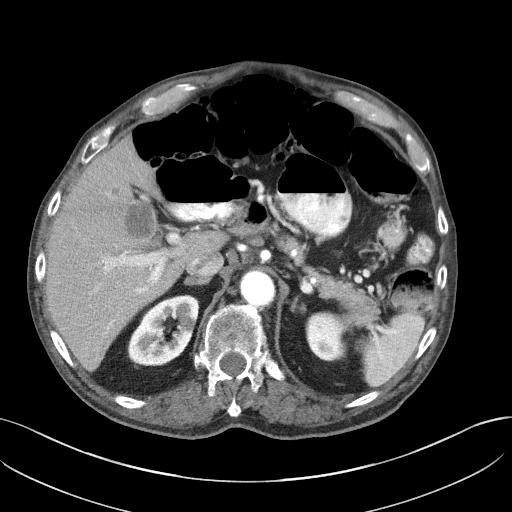
[im 91/98  soft-tissue]
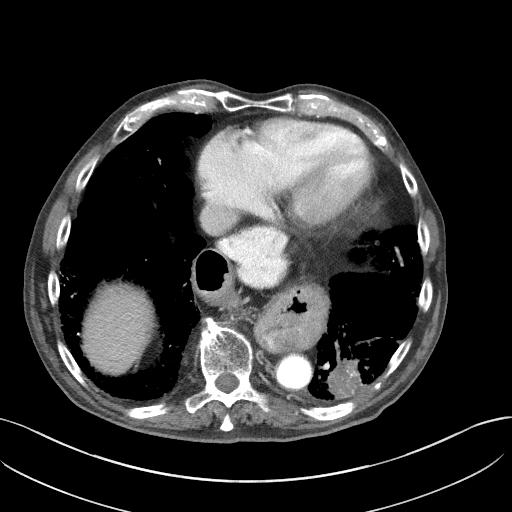
[im 91/98  bone]
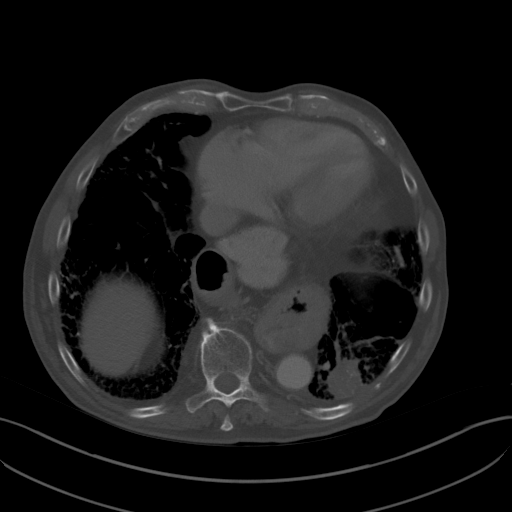

[Series 4: coronal st · coronal · 0.69mm/px · 3 of 86 slices shown]
[im 29/86  soft-tissue]
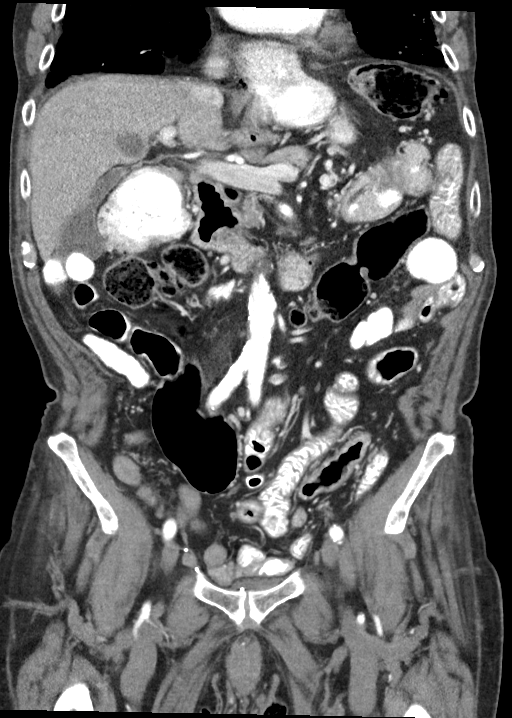
[im 38/86  soft-tissue]
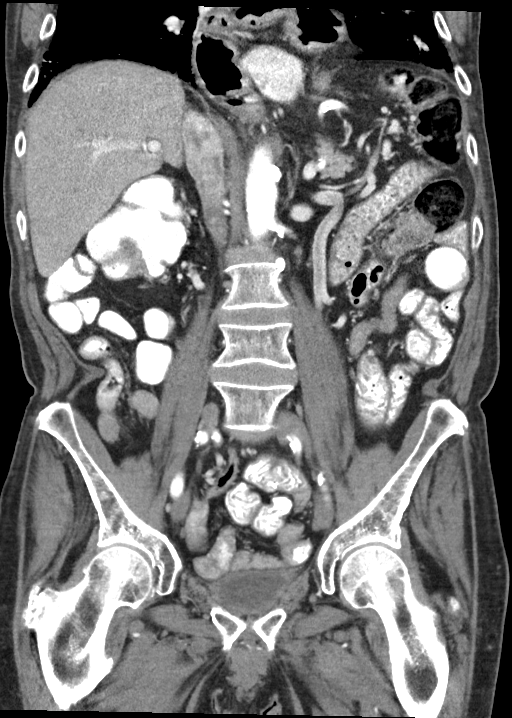
[im 48/86  soft-tissue]
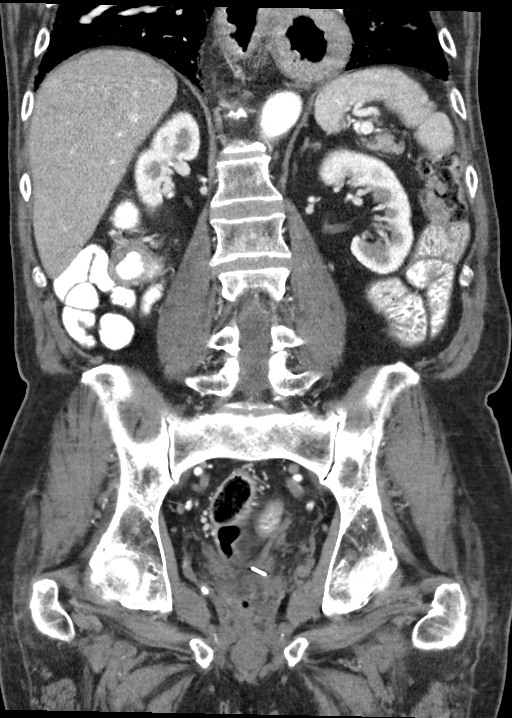

[12 of 46 positions shown; findings below may reference images not displayed]

RADIATION DOSE REDUCTION: This exam was performed according to the
departmental dose-optimization program which includes automated
exposure control, adjustment of the mA and/or kV according to
patient size and/or use of iterative reconstruction technique.

CONTRAST:  100mL OMNIPAQUE IOHEXOL 300 MG/ML  SOLN
FINDINGS: Lower chest: LEFT lower lobe pulmonary mass showing hypermetabolic
features on recent PET imaging is partially visualized with
heterogeneous appearance and abutting the pleura in the LEFT lung
base (image [DATE]) 4.5 x 2.5 cm.

Additional nodularity contiguous with this area extends inferiorly
also seen on previous imaging and unchanged.

Septal thickening, bronchiectasis and honeycombing compatible with
interstitial lung disease similar to previous imaging as well. Large
hiatal hernia extends into the chest with gastric wall thickening
which is diffuse. The stomach is inverted with the greater curvature
directed cephalad.

Hepatobiliary: Area of increased metabolic activity on the patient's
PET scan corresponds to an area in the medial aspect of the liver
(image [DATE]) hepatic subsegment V measuring 2.7 x 2.3 cm.

Numerous other lesions are present with subtle low-density near
isodense to the obvious metastatic lesion described above. For
instance, on image [DATE] there are 2 subcentimeter areas and a third
adjacent on image 16 all measuring between 6 and 8 mm. Medial
segment of the LEFT hepatic lobe also with a 5 mm lesion on image
[DATE] at least 10 additional lesions in the liver. Portal vein is
patent. No pericholecystic stranding. No sign of biliary duct
distension.

Pancreas: Normal, without mass, inflammation or ductal dilatation.

Spleen: Spleen normal size and contour no focal splenic lesion.
Distortion of splenic positioning secondary to large hiatal hernia
and traction on gastrosplenic ligament.

Adrenals/Urinary Tract:

Adrenal glands are unremarkable. Symmetric renal enhancement. No
sign of hydronephrosis. No suspicious renal lesion or perinephric
stranding.

Urinary bladder is grossly unremarkable. Urinary bladder is under
distended which does limit assessment.

Stomach/Bowel: Inverted stomach extending into the chest as
described. A portion of the transverse colon is also herniated into
the chest. Oral contrast passes this level. Sigmoid colon is quite
tortuous. No sign of bowel obstruction. Colonic diverticulosis.
Appendix is normal. The cecum with slightly higher position
following herniation of the transverse colon into the chest.
Question mild cecal thickening and mild thickening of the distal
transverse colon. Congestion of mesenteric vessels in the transverse
mesocolon following herniation of a portion of the transverse colon
into the chest.

Adjacent 2 the tortuous sigmoid colon there is a bowel loop, mid to
distal jejunum which passes from LEFT to RIGHT at the root of the
mesentery just above the mobile sigmoid. Distortion of mesenteric
structures is noted at this level. Oral contrast passes beyond this
level as well and there is no sign of adjacent stranding. Small
bowel loops pass into the RIGHT hemiabdomen in fill the RIGHT
pericolic gutter inferior and lateral to the cecum which is elevated
relative to its previous position.

Moderately large duodenal diverticulum similar to prior imaging.
LEFT inguinal hernia containing small bowel loops without
obstruction similar to prior imaging.

Vascular/Lymphatic:

Aortic atherosclerosis. No sign of aneurysm. Smooth contour of the
IVC. There is no gastrohepatic or hepatoduodenal ligament
lymphadenopathy. No retroperitoneal or mesenteric lymphadenopathy.

No pelvic sidewall lymphadenopathy.

Reproductive: Unremarkable following prostatectomy to the extent
evaluated.

Other: No ascites.

Musculoskeletal: Subacute fractures of LEFT-sided ribs as on
previous imaging. Spinal degenerative changes.
IMPRESSION: 1. LEFT lower lobe pulmonary mass showing hypermetabolic features on
recent PET imaging, consistent with known primary lung cancer.
2. Numerous other lesions in the liver, consistent with metastatic
disease.
3. Interval herniation of the transverse colon into the chest
through the esophageal hiatus anterior to the stomach with mild
cecal thickening and mild thickening of the colon distal to the site
of herniation without signs of obstruction but with congestive
changes in the adjacent vessels and distortion of vascular
structures likely near the origin of the gastrocolic trunk which may
impair venous outflow. Given the current configuration would suggest
correlation with any change in abdominal symptoms or new symptoms of
abdominal pain.
4. Passage of small-bowel from LEFT to RIGHT at the root of the
small bowel mesentery and adjacent to the sigmoid which is higher
presumably lacking fixation to the retroperitoneum based on course.
This may be due to change in position of abdominal structures due to
herniated colon and distortion of the mesentery due to the mobile
sigmoid. The possibility of an internal hernia in this region
without obstruction or current evidence of stranding is considered.
5. For findings above, particularly related to herniated transverse
colon, would correlate with any new symptoms of abdominal pain or
worsening of previous symptoms of abdominal pain or discomfort, if
so would suggest prompt evaluation. In the absence of current
symptoms close follow-up is suggested given the appearance of the
cecum and transverse colon and the narrowed but not obstructed
transverse colon as it enters the hiatal hernia anterior to the
stomach.
6. Pre-existing herniation of the stomach into the chest is not
changed, stomach lies posterior to the transverse colon.

Aortic Atherosclerosis ([id number]-C64.4).

These results will be called to the ordering clinician or
representative by the Radiologist Assistant, and communication
documented in the PACS or [REDACTED].

## 2023-05-31 IMAGING — US US BIOPSY CORE LIVER
1 series · 15 of 25 positions shown · non-contrast
Comparison: none

CLINICAL DATA: Lung carcinoma.  Multiple liver lesions.

EXAM:
ULTRASOUND-GUIDED CORE LIVER BIOPSY
TECHNIQUE: An ultrasound guided liver biopsy was thoroughly discussed with the
patient and questions were answered. The benefits, risks,
alternatives, and complications were also discussed. The patient
understands and wishes to proceed with the procedure. A verbal as
well as written consent was obtained.

[Series 1: us biopsy mc & wl · 15 of 28 slices shown]
[im 1/28]
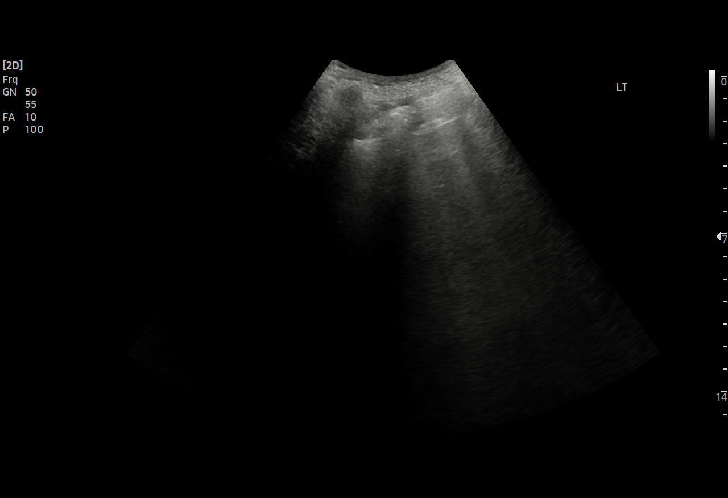
[im 3/28]
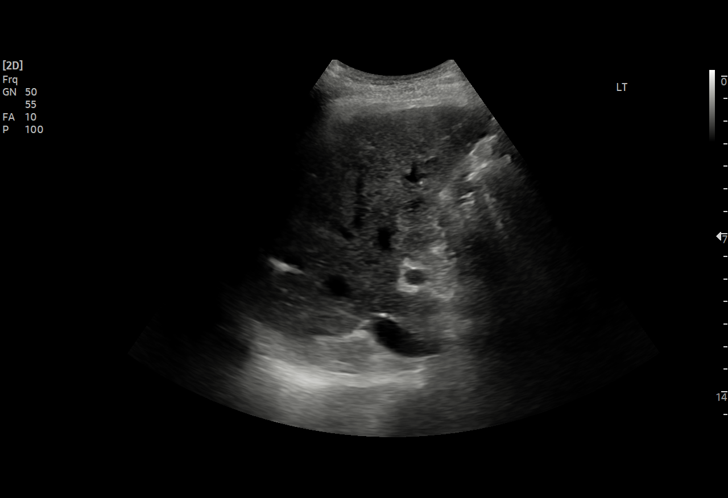
[im 5/28]
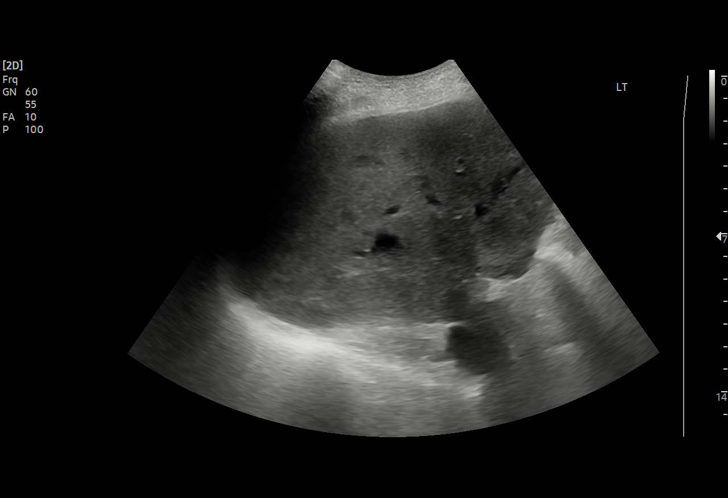
[im 6/28]
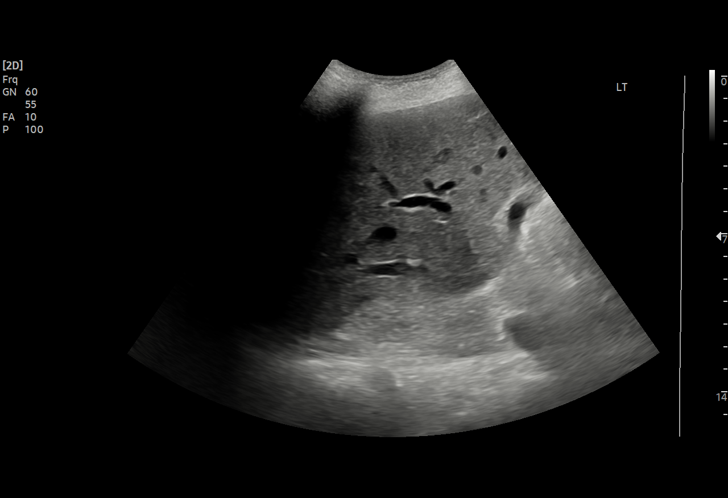
[im 8/28]
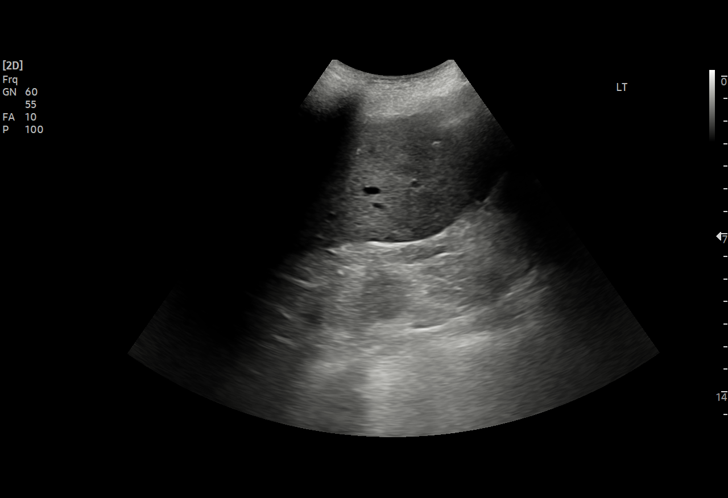
[im 11/28]
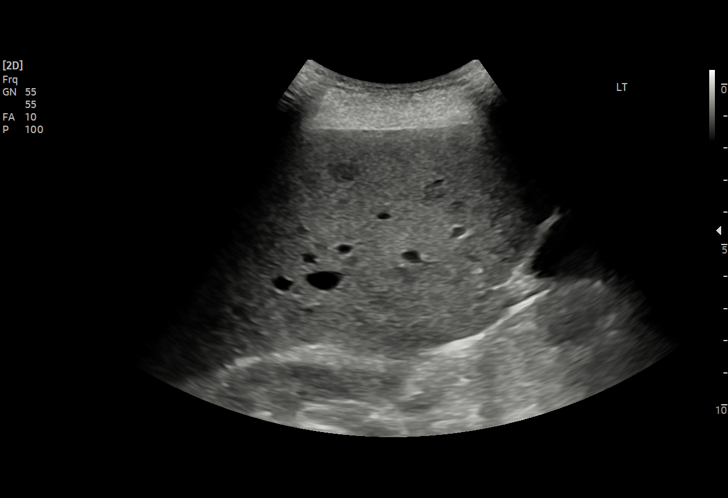
[im 12/28]
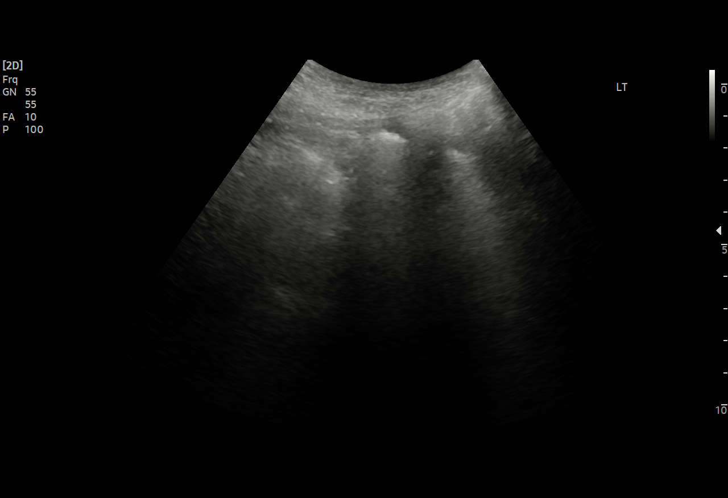
[im 14/28]
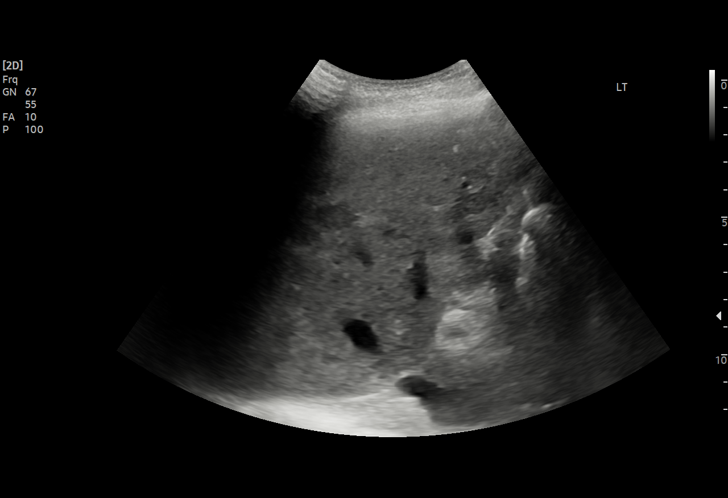
[im 16/28]
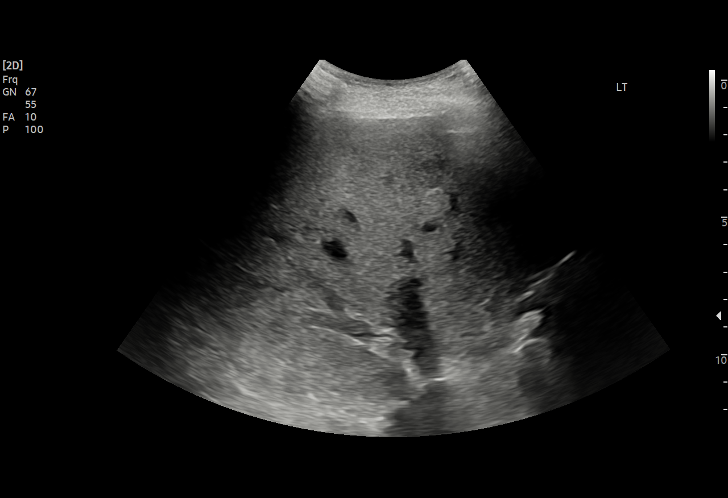
[im 17/28]
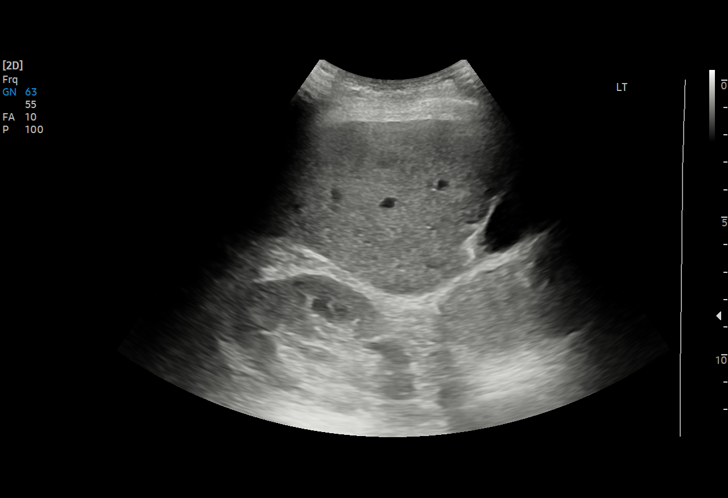
[im 20/28]
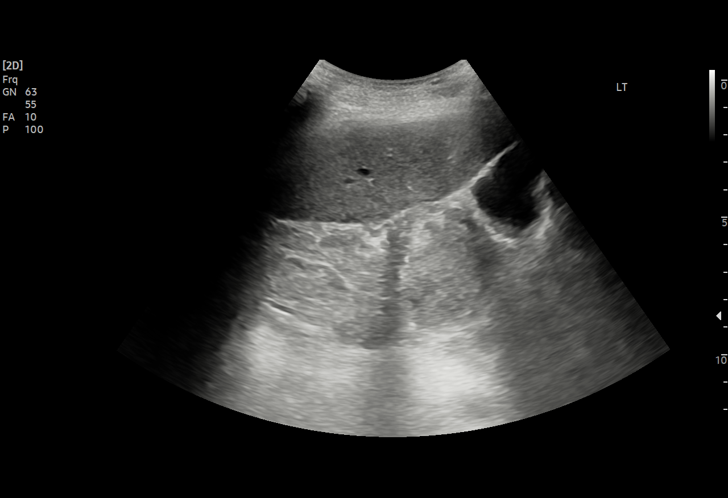
[im 22/28]
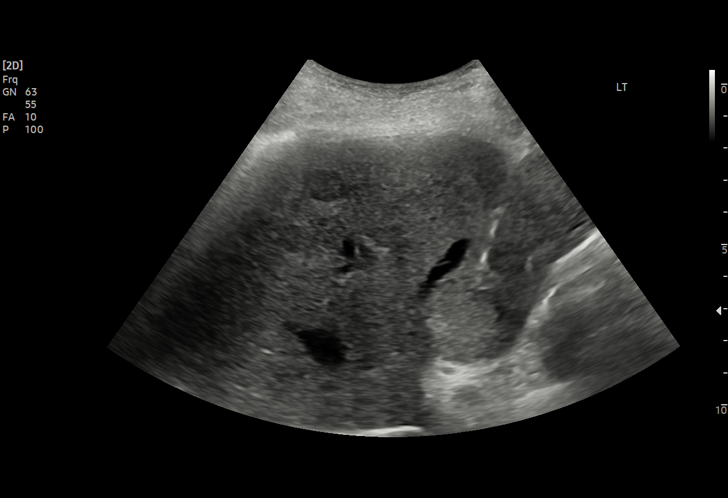
[im 23/28]
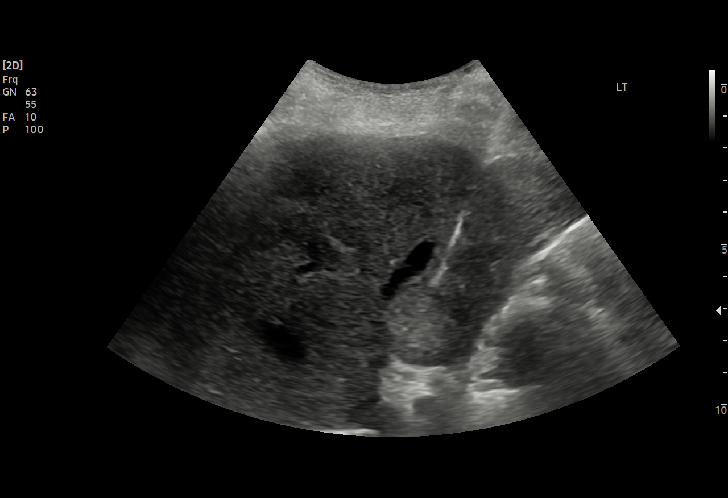
[im 25/28]
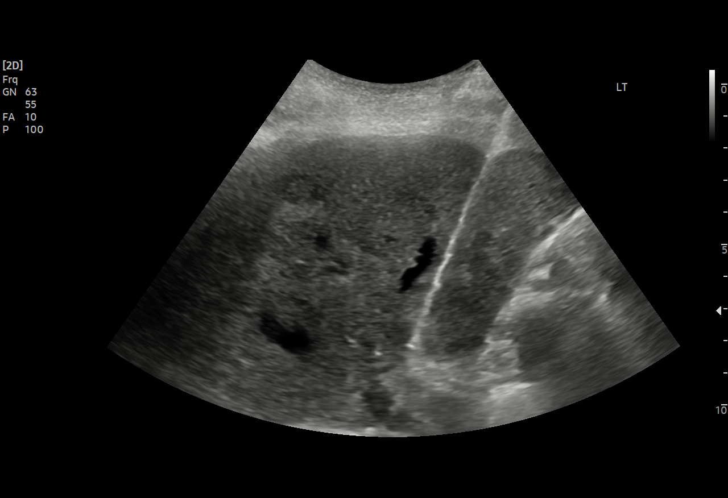
[im 28/28]
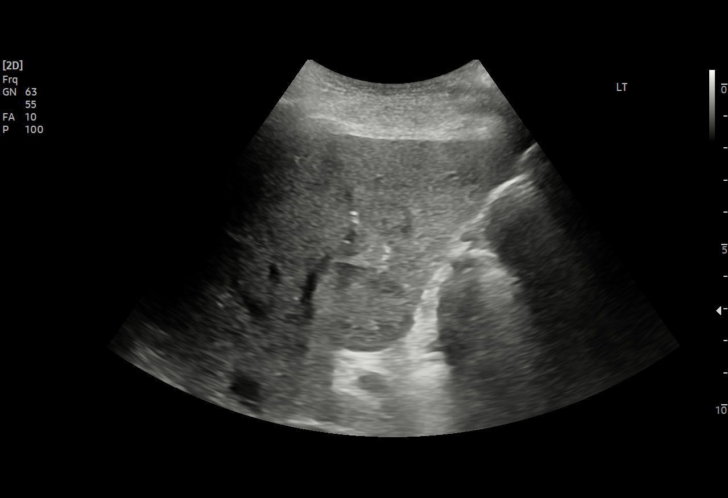

[15 of 25 positions shown; findings below may reference images not displayed]

Survey ultrasound of the liver was performed, representative lesion
localized, and an appropriate skin entry site was determined. Skin
site was marked, prepped with Betadine, and draped in usual sterile
fashion, and infiltrated locally with 1% lidocaine.

Intravenous Fentanyl 26mcg and Versed 0.5mg were administered as
conscious sedation during continuous monitoring of the patient's
level of consciousness and physiological / cardiorespiratory status
by the radiology RN, with a total moderate sedation time of 10
minutes. A 17 gauge trocar needle was advanced under ultrasound
guidance into the liver to the margin of the segment 4 lesion.
Multiple coaxial 28gauge core samples were then obtained through the
guide needle. The guide needle was removed. Post procedure scans
demonstrate no apparent complication.

COMPLICATIONS:
COMPLICATIONS
None immediate
FINDINGS: Multiple liver lesions Were identified corresponding to CT findings.
Representative core biopsy samples obtained as above.
IMPRESSION: 1. Technically successful ultrasound guided core liver lesion
biopsy.
